# Patient Record
Sex: Female | Born: 1940 | Race: Black or African American | Hispanic: No | Smoking: Never smoker
Health system: Southern US, Community
[De-identification: ages and names within clinical notes are randomized; demographics above are authoritative.]

## PROBLEM LIST (undated history)

## (undated) DIAGNOSIS — K219 Gastro-esophageal reflux disease without esophagitis: Secondary | ICD-10-CM

## (undated) DIAGNOSIS — E079 Disorder of thyroid, unspecified: Secondary | ICD-10-CM

## (undated) DIAGNOSIS — I10 Essential (primary) hypertension: Secondary | ICD-10-CM

## (undated) DIAGNOSIS — N189 Chronic kidney disease, unspecified: Secondary | ICD-10-CM

## (undated) DIAGNOSIS — G459 Transient cerebral ischemic attack, unspecified: Secondary | ICD-10-CM

## (undated) DIAGNOSIS — E785 Hyperlipidemia, unspecified: Secondary | ICD-10-CM

## (undated) HISTORY — PX: CHOLECYSTECTOMY: SHX55

## (undated) HISTORY — PX: ABDOMINAL HYSTERECTOMY: SHX81

## (undated) HISTORY — PX: BREAST SURGERY: SHX581

---

## 2000-04-14 DIAGNOSIS — G459 Transient cerebral ischemic attack, unspecified: Secondary | ICD-10-CM

## 2000-04-14 HISTORY — DX: Transient cerebral ischemic attack, unspecified: G45.9

## 2009-03-11 ENCOUNTER — Ambulatory Visit: Payer: Self-pay | Admitting: Diagnostic Radiology

## 2009-03-11 ENCOUNTER — Emergency Department (HOSPITAL_BASED_OUTPATIENT_CLINIC_OR_DEPARTMENT_OTHER): Admission: EM | Admit: 2009-03-11 | Discharge: 2009-03-11 | Payer: Self-pay | Admitting: Emergency Medicine

## 2009-08-07 ENCOUNTER — Encounter (HOSPITAL_COMMUNITY): Admission: RE | Admit: 2009-08-07 | Discharge: 2009-10-12 | Payer: Self-pay | Admitting: Internal Medicine

## 2010-01-25 ENCOUNTER — Encounter: Payer: Self-pay | Admitting: Emergency Medicine

## 2010-01-25 ENCOUNTER — Ambulatory Visit: Payer: Self-pay | Admitting: Diagnostic Radiology

## 2010-01-26 ENCOUNTER — Inpatient Hospital Stay (HOSPITAL_COMMUNITY): Admission: EM | Admit: 2010-01-26 | Discharge: 2010-01-26 | Payer: Self-pay | Admitting: Internal Medicine

## 2010-06-26 LAB — PTH, INTACT AND CALCIUM: PTH: 145 pg/mL — ABNORMAL HIGH (ref 14.0–72.0)

## 2010-06-26 LAB — CBC
HCT: 37 % (ref 36.0–46.0)
MCH: 28.3 pg (ref 26.0–34.0)
MCHC: 32.4 g/dL (ref 30.0–36.0)
Platelets: 207 10*3/uL (ref 150–400)
RBC: 4.32 MIL/uL (ref 3.87–5.11)
RDW: 13.2 % (ref 11.5–15.5)
WBC: 6.7 10*3/uL (ref 4.0–10.5)

## 2010-06-26 LAB — COMPREHENSIVE METABOLIC PANEL
ALT: 36 U/L — ABNORMAL HIGH (ref 0–35)
Albumin: 3.4 g/dL — ABNORMAL LOW (ref 3.5–5.2)
Alkaline Phosphatase: 82 U/L (ref 39–117)
Chloride: 107 mEq/L (ref 96–112)
Glucose, Bld: 91 mg/dL (ref 70–99)
Potassium: 4.1 mEq/L (ref 3.5–5.1)
Sodium: 137 mEq/L (ref 135–145)
Total Bilirubin: 0.4 mg/dL (ref 0.3–1.2)
Total Protein: 6 g/dL (ref 6.0–8.3)

## 2010-06-26 LAB — HEMOGLOBIN A1C
Hgb A1c MFr Bld: 8.7 % — ABNORMAL HIGH (ref <5.7)
Mean Plasma Glucose: 203 mg/dL — ABNORMAL HIGH (ref <117)

## 2010-06-26 LAB — BASIC METABOLIC PANEL
Calcium: 10.6 mg/dL — ABNORMAL HIGH (ref 8.4–10.5)
GFR calc Af Amer: >60 mL/min (ref >60)
GFR calc non Af Amer: >60 mL/min (ref >60)
Glucose, Bld: 163 mg/dL — ABNORMAL HIGH (ref 70–99)
Sodium: 139 mEq/L (ref 135–145)

## 2010-06-26 LAB — CARDIAC PANEL(CRET KIN+CKTOT+MB+TROPI)
CK, MB: 2.1 ng/mL (ref 0.3–4.0)
Total CK: 119 U/L (ref 7–177)
Troponin I: 0.01 ng/mL (ref 0.00–0.06)

## 2010-06-26 LAB — GLUCOSE, CAPILLARY: Glucose-Capillary: 89 mg/dL (ref 70–99)

## 2010-06-26 LAB — VITAMIN D 1,25 DIHYDROXY: Vitamin D 1, 25 (OH)2 Total: 39 pg/mL (ref 18–72)

## 2010-06-26 LAB — PHOSPHORUS: Phosphorus: 3.1 mg/dL (ref 2.3–4.6)

## 2010-06-26 LAB — HEPATIC FUNCTION PANEL
Indirect Bilirubin: 0.3 mg/dL (ref 0.3–0.9)
Total Protein: 6 g/dL (ref 6.0–8.3)

## 2010-06-26 LAB — LIPID PANEL
Cholesterol: 98 mg/dL (ref 0–200)
HDL: 39 mg/dL — ABNORMAL LOW (ref >39)
LDL Cholesterol: 47 mg/dL (ref 0–99)
Total CHOL/HDL Ratio: 2.5 RATIO

## 2010-06-27 LAB — BASIC METABOLIC PANEL
BUN: 27 mg/dL — ABNORMAL HIGH (ref 6–23)
Chloride: 105 mEq/L (ref 96–112)
Creatinine, Ser: 1 mg/dL (ref 0.4–1.2)

## 2010-06-27 LAB — URINALYSIS, ROUTINE W REFLEX MICROSCOPIC
Ketones, ur: NEGATIVE mg/dL
Nitrite: NEGATIVE
Protein, ur: NEGATIVE mg/dL
Urobilinogen, UA: 0.2 mg/dL (ref 0.0–1.0)

## 2010-06-27 LAB — DIFFERENTIAL
Basophils Absolute: 0 10*3/uL (ref 0.0–0.1)
Basophils Relative: 1 % (ref 0–1)
Eosinophils Absolute: 0.2 10*3/uL (ref 0.0–0.7)
Eosinophils Relative: 2 % (ref 0–5)
Lymphocytes Relative: 35 % (ref 12–46)

## 2010-06-27 LAB — GLUCOSE, CAPILLARY: Glucose-Capillary: 209 mg/dL — ABNORMAL HIGH (ref 70–99)

## 2010-06-27 LAB — CBC
MCH: 29.6 pg (ref 26.0–34.0)
MCHC: 33.4 g/dL (ref 30.0–36.0)
MCV: 88.7 fL (ref 78.0–100.0)
Platelets: 203 10*3/uL (ref 150–400)
RDW: 12.8 % (ref 11.5–15.5)
WBC: 6.6 10*3/uL (ref 4.0–10.5)

## 2011-04-22 ENCOUNTER — Emergency Department (HOSPITAL_BASED_OUTPATIENT_CLINIC_OR_DEPARTMENT_OTHER)
Admission: EM | Admit: 2011-04-22 | Discharge: 2011-04-22 | Disposition: A | Discharge status: Home / Self Care | Payer: Medicare Other | Attending: Emergency Medicine | Admitting: Emergency Medicine

## 2011-04-22 ENCOUNTER — Encounter: Payer: Self-pay | Admitting: Family Medicine

## 2011-04-22 ENCOUNTER — Other Ambulatory Visit: Payer: Self-pay

## 2011-04-22 ENCOUNTER — Emergency Department (INDEPENDENT_AMBULATORY_CARE_PROVIDER_SITE_OTHER): Payer: Medicare Other

## 2011-04-22 DIAGNOSIS — E119 Type 2 diabetes mellitus without complications: Secondary | ICD-10-CM | POA: Insufficient documentation

## 2011-04-22 DIAGNOSIS — R109 Unspecified abdominal pain: Secondary | ICD-10-CM | POA: Insufficient documentation

## 2011-04-22 DIAGNOSIS — M25519 Pain in unspecified shoulder: Secondary | ICD-10-CM

## 2011-04-22 DIAGNOSIS — W19XXXA Unspecified fall, initial encounter: Secondary | ICD-10-CM

## 2011-04-22 DIAGNOSIS — Z79899 Other long term (current) drug therapy: Secondary | ICD-10-CM | POA: Insufficient documentation

## 2011-04-22 DIAGNOSIS — N189 Chronic kidney disease, unspecified: Secondary | ICD-10-CM | POA: Insufficient documentation

## 2011-04-22 DIAGNOSIS — K219 Gastro-esophageal reflux disease without esophagitis: Secondary | ICD-10-CM | POA: Insufficient documentation

## 2011-04-22 DIAGNOSIS — R079 Chest pain, unspecified: Secondary | ICD-10-CM

## 2011-04-22 DIAGNOSIS — I129 Hypertensive chronic kidney disease with stage 1 through stage 4 chronic kidney disease, or unspecified chronic kidney disease: Secondary | ICD-10-CM | POA: Insufficient documentation

## 2011-04-22 DIAGNOSIS — M79609 Pain in unspecified limb: Secondary | ICD-10-CM

## 2011-04-22 HISTORY — DX: Chronic kidney disease, unspecified: N18.9

## 2011-04-22 HISTORY — DX: Essential (primary) hypertension: I10

## 2011-04-22 HISTORY — DX: Gastro-esophageal reflux disease without esophagitis: K21.9

## 2011-04-22 LAB — COMPREHENSIVE METABOLIC PANEL
ALT: 33 U/L (ref 0–35)
AST: 18 U/L (ref 0–37)
Alkaline Phosphatase: 98 U/L (ref 39–117)
CO2: 28 mEq/L (ref 19–32)
Calcium: 12.5 mg/dL — ABNORMAL HIGH (ref 8.4–10.5)
GFR calc non Af Amer: 50 mL/min — ABNORMAL LOW (ref >90)
Glucose, Bld: 114 mg/dL — ABNORMAL HIGH (ref 70–99)
Potassium: 4.2 mEq/L (ref 3.5–5.1)
Sodium: 138 mEq/L (ref 135–145)

## 2011-04-22 LAB — URINALYSIS, ROUTINE W REFLEX MICROSCOPIC
Bilirubin Urine: NEGATIVE
Hgb urine dipstick: NEGATIVE
Nitrite: NEGATIVE
Protein, ur: NEGATIVE mg/dL
Urobilinogen, UA: 0.2 mg/dL (ref 0.0–1.0)

## 2011-04-22 LAB — CBC
HCT: 40 % (ref 36.0–46.0)
Hemoglobin: 13.3 g/dL (ref 12.0–15.0)
MCH: 28.1 pg (ref 26.0–34.0)
MCHC: 33.3 g/dL (ref 30.0–36.0)
RDW: 13.7 % (ref 11.5–15.5)

## 2011-04-22 LAB — DIFFERENTIAL
Basophils Absolute: 0 10*3/uL (ref 0.0–0.1)
Basophils Relative: 0 % (ref 0–1)
Eosinophils Absolute: 0.2 10*3/uL (ref 0.0–0.7)
Monocytes Absolute: 0.9 10*3/uL (ref 0.1–1.0)
Monocytes Relative: 11 % (ref 3–12)
Neutrophils Relative %: 46 % (ref 43–77)

## 2011-04-22 MED ORDER — HYDROCODONE-ACETAMINOPHEN 5-325 MG PO TABS: 1.0000 | ORAL_TABLET | ORAL | Status: AC | PRN

## 2011-04-22 NOTE — ED Provider Notes (Signed)
1:57 PM  Date: 04/22/2011  Rate: 61  Rhythm: normal sinus rhythm  QRS Axis: normal  Intervals: normal  ST/T Wave abnormalities: normal  Conduction Disutrbances:none  Narrative Interpretation: Normal EKG  Old EKG Reviewed: none available    Carleene Cooper III, MD 04/22/11 1358

## 2011-04-22 NOTE — ED Notes (Addendum)
PCP is Dr. Venetia Maxon in [place].

## 2011-04-22 NOTE — ED Notes (Signed)
Pt c/o upper abdominal pain and sts "I've had problems like this and taking medicine for it for years". Pt sts pain has not resolved with medication "this time". Pt also c/o left arm pain x 3 months post fall. Pt denies shob, v/d.

## 2011-04-22 NOTE — ED Provider Notes (Signed)
History     CSN: [id number]  Arrival date & time 04/22/11  1238   None     Chief Complaint  Patient presents with  . Abdominal Pain    (Consider location/radiation/quality/duration/timing/severity/associated sxs/prior treatment) Patient is a 71 y.o. female presenting with shoulder pain. The history is provided by the patient and a relative. No language interpreter was used.  Shoulder Pain This is a new problem. The current episode started 1 to 4 weeks ago. The problem occurs constantly. The problem has been gradually worsening. Associated symptoms include joint swelling. The symptoms are aggravated by nothing. She has tried nothing for the symptoms. The treatment provided moderate relief.  Shoulder Pain This is a new problem. The current episode started 1 to 4 weeks ago. The problem occurs constantly. The problem has been gradually worsening. The symptoms are aggravated by nothing. She has tried nothing for the symptoms. The treatment provided moderate relief.  Pt complains of pain in left shoulder after falling 3 weeks ago.  Pt reports arm and shoulder feel swollen.  Pt reports she has not felt well for several weeks.  Pt reports she sees Dr. Ludwig Clarks.  Pt reports her glucose has been high and she has been told her kidney function is decreasing.    Past Medical History  Diagnosis Date  . GERD (gastroesophageal reflux disease)   . Diabetes mellitus   . Hypertension   . Chronic kidney disease     Past Surgical History  Procedure Date  . Abdominal hysterectomy   . Breast surgery   . Cholecystectomy     No family history on file.  History  Substance Use Topics  . Smoking status: Never Smoker   . Smokeless tobacco: Not on file  . Alcohol Use: No    OB History    Grav Para Term Preterm Abortions TAB SAB Ect Mult Living                  Review of Systems  Musculoskeletal: Positive for joint swelling.  All other systems reviewed and are negative.    Allergies    Codeine  Home Medications   Current Outpatient Rx  Name Route Sig Dispense Refill  . ATORVASTATIN CALCIUM 80 MG PO TABS Oral Take 80 mg by mouth daily.      Marland Kitchen CLONIDINE HCL 0.3 MG PO TABS Oral Take 0.3 mg by mouth at bedtime.      . CLOPIDOGREL BISULFATE 75 MG PO TABS Oral Take 75 mg by mouth daily.      . DEXLANSOPRAZOLE 60 MG PO CPDR Oral Take 60 mg by mouth daily.      Marland Kitchen EZETIMIBE 10 MG PO TABS Oral Take 10 mg by mouth daily.      . FUROSEMIDE 20 MG PO TABS Oral Take 40 mg by mouth 2 (two) times daily.      Marland Kitchen LINAGLIPTIN 5 MG PO TABS Oral Take 5 mg by mouth daily.      Marland Kitchen METFORMIN HCL 500 MG PO TABS Oral Take 500 mg by mouth 2 (two) times daily with a meal.      . METOPROLOL SUCCINATE ER 200 MG PO TB24 Oral Take 100 mg by mouth daily.      Marland Kitchen SPIRONOLACTONE 25 MG PO TABS Oral Take 50 mg by mouth daily.      Marland Kitchen VALSARTAN 320 MG PO TABS Oral Take 320 mg by mouth daily.        BP 164/68  Pulse 62  Temp(Src) 97.7 F (36.5 C) (Oral)  Resp 16  Ht 5\' 8"  (1.727 m)  Wt 221 lb (100.245 kg)  BMI 33.60 kg/m2  SpO2 98%  Physical Exam  Nursing note and vitals reviewed. Constitutional: She is oriented to person, place, and time. She appears well-developed and well-nourished.  HENT:  Head: Normocephalic and atraumatic.  Right Ear: External ear normal.  Left Ear: External ear normal.  Nose: Nose normal.  Mouth/Throat: Oropharynx is clear and moist.  Eyes: Conjunctivae and EOM are normal. Pupils are equal, round, and reactive to light.  Neck: Normal range of motion. Neck supple.  Cardiovascular: Normal rate and normal heart sounds.   Pulmonary/Chest: Effort normal.  Abdominal: Soft.  Musculoskeletal: She exhibits tenderness.       Tender left shoulder and left humerus,  Decreased range of motion  Neurological: She is alert and oriented to person, place, and time. She has normal reflexes.  Skin: Skin is warm.  Psychiatric: She has a normal mood and affect.    ED Course  Procedures  (including critical care time)  Labs Reviewed - No data to display No results found.   1. Shoulder pain       MDM   Results for orders placed during the hospital encounter of 04/22/11  COMPREHENSIVE METABOLIC PANEL      Component Value Range   Sodium 138  135 - 145 (mEq/L)   Potassium 4.2  3.5 - 5.1 (mEq/L)   Chloride 99  96 - 112 (mEq/L)   CO2 28  19 - 32 (mEq/L)   Glucose, Bld 114 (*) 70 - 99 (mg/dL)   BUN 29 (*) 6 - 23 (mg/dL)   Creatinine, Ser 4.09  0.50 - 1.10 (mg/dL)   Calcium 81.1 (*) 8.4 - 10.5 (mg/dL)   Total Protein 8.1  6.0 - 8.3 (g/dL)   Albumin 4.4  3.5 - 5.2 (g/dL)   AST 18  0 - 37 (U/L)   ALT 33  0 - 35 (U/L)   Alkaline Phosphatase 98  39 - 117 (U/L)   Total Bilirubin 0.3  0.3 - 1.2 (mg/dL)   GFR calc non Af Amer 50 (*) >90 (mL/min)   GFR calc Af Amer 58 (*) >90 (mL/min)  CBC      Component Value Range   WBC 7.8  4.0 - 10.5 (K/uL)   RBC 4.74  3.87 - 5.11 (MIL/uL)   Hemoglobin 13.3  12.0 - 15.0 (g/dL)   HCT 91.4  78.2 - 95.6 (%)   MCV 84.4  78.0 - 100.0 (fL)   MCH 28.1  26.0 - 34.0 (pg)   MCHC 33.3  30.0 - 36.0 (g/dL)   RDW 21.3  08.6 - 57.8 (%)   Platelets 241  150 - 400 (K/uL)  DIFFERENTIAL      Component Value Range   Neutrophils Relative 46  43 - 77 (%)   Neutro Abs 3.6  1.7 - 7.7 (K/uL)   Lymphocytes Relative 41  12 - 46 (%)   Lymphs Abs 3.2  0.7 - 4.0 (K/uL)   Monocytes Relative 11  3 - 12 (%)   Monocytes Absolute 0.9  0.1 - 1.0 (K/uL)   Eosinophils Relative 2  0 - 5 (%)   Eosinophils Absolute 0.2  0.0 - 0.7 (K/uL)   Basophils Relative 0  0 - 1 (%)   Basophils Absolute 0.0  0.0 - 0.1 (K/uL)  URINALYSIS, ROUTINE W REFLEX MICROSCOPIC      Component Value Range  Color, Urine YELLOW  YELLOW    APPearance CLEAR  CLEAR    Specific Gravity, Urine 1.009  1.005 - 1.030    pH 5.0  5.0 - 8.0    Glucose, UA NEGATIVE  NEGATIVE (mg/dL)   Hgb urine dipstick NEGATIVE  NEGATIVE    Bilirubin Urine NEGATIVE  NEGATIVE    Ketones, ur NEGATIVE  NEGATIVE  (mg/dL)   Protein, ur NEGATIVE  NEGATIVE (mg/dL)   Urobilinogen, UA 0.2  0.0 - 1.0 (mg/dL)   Nitrite NEGATIVE  NEGATIVE    Leukocytes, UA NEGATIVE  NEGATIVE    Dg Chest 2 View  04/22/2011  *RADIOLOGY REPORT*  Clinical Data: Chest pain  CHEST - 2 VIEW  Comparison: January 25, 2010  Findings: The cardiac silhouette, mediastinum, pulmonary vasculature are within normal limits.  Both lungs are clear. There is no acute bony abnormality.  IMPRESSION: There is no evidence of acute cardiac or pulmonary process.  Original Report Authenticated By: Brandon Melnick, M.D.   Dg Shoulder Left  04/22/2011  *RADIOLOGY REPORT*  Clinical Data: Pain after fall  LEFT SHOULDER - 2+ VIEW  Comparison: None.  Findings: The [facility] joint is intact and the subacromial space is maintained.  There is no evidence of fracture or dislocation. There is mild undersurface spurring at the [facility] joint which can predispose to rotator cuff injury.  IMPRESSION: There is no evidence of acute abnormality.  If there is clinical concern regarding a rotator cuff injury, MRI may be of help.  Original Report Authenticated By: Brandon Melnick, M.D.   Dg Humerus Left  04/22/2011  *RADIOLOGY REPORT*  Clinical Data: Pain after fall  LEFT HUMERUS - 2+ VIEW  Comparison: None.  Findings: There is no evidence of bone, joint, or soft tissue abnormality.  IMPRESSION: Negative left forearm.  Original Report Authenticated By: Brandon Melnick, M.D.      Medical screening examination/treatment/procedure(s) were performed by non-physician practitioner and as supervising physician I was immediately available for consultation/collaboration. Osvaldo Human, M.D.      [place], [place] 04/25/11 1610  Carleene Cooper III, MD 04/25/11 [phone]

## 2011-07-29 ENCOUNTER — Encounter (HOSPITAL_BASED_OUTPATIENT_CLINIC_OR_DEPARTMENT_OTHER): Payer: Self-pay | Admitting: Emergency Medicine

## 2011-07-29 ENCOUNTER — Emergency Department (HOSPITAL_BASED_OUTPATIENT_CLINIC_OR_DEPARTMENT_OTHER)
Admission: EM | Admit: 2011-07-29 | Discharge: 2011-07-29 | Disposition: A | Discharge status: Home / Self Care | Payer: Medicare Other | Attending: Emergency Medicine | Admitting: Emergency Medicine

## 2011-07-29 DIAGNOSIS — Z833 Family history of diabetes mellitus: Secondary | ICD-10-CM | POA: Insufficient documentation

## 2011-07-29 DIAGNOSIS — I129 Hypertensive chronic kidney disease with stage 1 through stage 4 chronic kidney disease, or unspecified chronic kidney disease: Secondary | ICD-10-CM | POA: Insufficient documentation

## 2011-07-29 DIAGNOSIS — Z823 Family history of stroke: Secondary | ICD-10-CM | POA: Insufficient documentation

## 2011-07-29 DIAGNOSIS — K219 Gastro-esophageal reflux disease without esophagitis: Secondary | ICD-10-CM | POA: Insufficient documentation

## 2011-07-29 DIAGNOSIS — W57XXXA Bitten or stung by nonvenomous insect and other nonvenomous arthropods, initial encounter: Secondary | ICD-10-CM

## 2011-07-29 DIAGNOSIS — Z8249 Family history of ischemic heart disease and other diseases of the circulatory system: Secondary | ICD-10-CM | POA: Insufficient documentation

## 2011-07-29 DIAGNOSIS — E119 Type 2 diabetes mellitus without complications: Secondary | ICD-10-CM | POA: Insufficient documentation

## 2011-07-29 DIAGNOSIS — Z8673 Personal history of transient ischemic attack (TIA), and cerebral infarction without residual deficits: Secondary | ICD-10-CM | POA: Insufficient documentation

## 2011-07-29 DIAGNOSIS — Z8261 Family history of arthritis: Secondary | ICD-10-CM | POA: Insufficient documentation

## 2011-07-29 DIAGNOSIS — Z90710 Acquired absence of both cervix and uterus: Secondary | ICD-10-CM | POA: Insufficient documentation

## 2011-07-29 DIAGNOSIS — N189 Chronic kidney disease, unspecified: Secondary | ICD-10-CM | POA: Insufficient documentation

## 2011-07-29 DIAGNOSIS — R21 Rash and other nonspecific skin eruption: Secondary | ICD-10-CM | POA: Insufficient documentation

## 2011-07-29 DIAGNOSIS — Z9089 Acquired absence of other organs: Secondary | ICD-10-CM | POA: Insufficient documentation

## 2011-07-29 HISTORY — DX: Transient cerebral ischemic attack, unspecified: G45.9

## 2011-07-29 MED ORDER — PERMETHRIN 5 % EX CREA: TOPICAL_CREAM | CUTANEOUS | Status: AC

## 2011-07-29 NOTE — ED Notes (Signed)
Patient states she developed a itchy rash over arms on Saturday.  Now has red rash on her face and neck now.

## 2011-07-29 NOTE — Discharge Instructions (Signed)
Insect Bite Mosquitoes, flies, fleas, bedbugs, and many other insects can bite. Insect bites are different from insect stings. A sting is when venom is injected into the skin. Some insect bites can transmit infectious diseases. SYMPTOMS  Insect bites usually turn red, swell, and itch for 2 to 4 days. They often go away on their own. TREATMENT  Your caregiver may prescribe antibiotic medicines if a bacterial infection develops in the bite. HOME CARE INSTRUCTIONS  Do not scratch the bite area.   Keep the bite area clean and dry. Wash the bite area thoroughly with soap and water.   Put ice or cool compresses on the bite area.   Put ice in a plastic bag.   Place a towel between your skin and the bag.   Leave the ice on for 20 minutes, 4 times a day for the first 2 to 3 days, or as directed.   You may apply a baking soda paste, cortisone cream, or calamine lotion to the bite area as directed by your caregiver. This can help reduce itching and swelling.   Only take over-the-counter or prescription medicines as directed by your caregiver.   If you are given antibiotics, take them as directed. Finish them even if you start to feel better.  You may need a tetanus shot if:  You cannot remember when you had your last tetanus shot.   You have never had a tetanus shot.   The injury broke your skin.  If you get a tetanus shot, your arm may swell, get red, and feel warm to the touch. This is common and not a problem. If you need a tetanus shot and you choose not to have one, there is a rare chance of getting tetanus. Sickness from tetanus can be serious. SEEK IMMEDIATE MEDICAL CARE IF:   You have increased pain, redness, or swelling in the bite area.   You see a red line on the skin coming from the bite.   You have a fever.   You have joint pain.   You have a headache or neck pain.   You have unusual weakness.   You have a rash.   You have chest pain or shortness of breath.   You  have abdominal pain, nausea, or vomiting.   You feel unusually tired or sleepy.  MAKE SURE YOU:   Understand these instructions.   Will watch your condition.   Will get help right away if you are not doing well or get worse.  Document Released: 05/08/2004 Document Revised: 03/20/2011 Document Reviewed: 10/30/2010 [facility] Patient Information 2012 [place], [place].Insect Bite Mosquitoes, flies, fleas, bedbugs, and many other insects can bite. Insect bites are different from insect stings. A sting is when venom is injected into the skin. Some insect bites can transmit infectious diseases. SYMPTOMS  Insect bites usually turn red, swell, and itch for 2 to 4 days. They often go away on their own. TREATMENT  Your caregiver may prescribe antibiotic medicines if a bacterial infection develops in the bite. HOME CARE INSTRUCTIONS  Do not scratch the bite area.   Keep the bite area clean and dry. Wash the bite area thoroughly with soap and water.   Put ice or cool compresses on the bite area.   Put ice in a plastic bag.   Place a towel between your skin and the bag.   Leave the ice on for 20 minutes, 4 times a day for the first 2 to 3 days, or as directed.  You may apply a baking soda paste, cortisone cream, or calamine lotion to the bite area as directed by your caregiver. This can help reduce itching and swelling.   Only take over-the-counter or prescription medicines as directed by your caregiver.   If you are given antibiotics, take them as directed. Finish them even if you start to feel better.  You may need a tetanus shot if:  You cannot remember when you had your last tetanus shot.   You have never had a tetanus shot.   The injury broke your skin.  If you get a tetanus shot, your arm may swell, get red, and feel warm to the touch. This is common and not a problem. If you need a tetanus shot and you choose not to have one, there is a rare chance of getting tetanus. Sickness from  tetanus can be serious. SEEK IMMEDIATE MEDICAL CARE IF:   You have increased pain, redness, or swelling in the bite area.   You see a red line on the skin coming from the bite.   You have a fever.   You have joint pain.   You have a headache or neck pain.   You have unusual weakness.   You have a rash.   You have chest pain or shortness of breath.   You have abdominal pain, nausea, or vomiting.   You feel unusually tired or sleepy.  MAKE SURE YOU:   Understand these instructions.   Will watch your condition.   Will get help right away if you are not doing well or get worse.  Document Released: 05/08/2004 Document Revised: 03/20/2011 Document Reviewed: 10/30/2010 [facility] Patient Information 2012 [place], [place].

## 2011-07-29 NOTE — ED Provider Notes (Signed)
Medical screening examination/treatment/procedure(s) were performed by non-physician practitioner and as supervising physician I was immediately available for consultation/collaboration.   Loren Racer, MD 07/29/11 [phone]

## 2011-07-29 NOTE — ED Notes (Signed)
Woke up Sunday with rash on upper body seams to be getting worse

## 2011-07-29 NOTE — ED Provider Notes (Signed)
History     CSN: [id number]  Arrival date & time 07/29/11  1241   First MD Initiated Contact with Patient 07/29/11 1433      Chief Complaint  Patient presents with  . Rash    Rash on upper boby with itching     (Consider location/radiation/quality/duration/timing/severity/associated sxs/prior treatment) Patient is a 71 y.o. female presenting with rash. The history is provided by the patient. No language interpreter was used.  Rash  This is a new problem. The current episode started more than 2 days ago. The problem has not changed since onset.The problem is associated with nothing. There has been no fever. The rash is present on the left arm and right arm. The pain is at a severity of 5/10. The pain is mild. Associated symptoms include itching. She has tried nothing for the symptoms. The treatment provided no relief. Risk factors include new medications.   Pt complains of a itchy rash Past Medical History  Diagnosis Date  . GERD (gastroesophageal reflux disease)   . Diabetes mellitus   . Hypertension   . Chronic kidney disease   . TIA (transient ischemic attack) 2002    Past Surgical History  Procedure Date  . Abdominal hysterectomy   . Breast surgery   . Cholecystectomy     Family History  Problem Relation Age of Onset  . Rheum arthritis Mother   . Diabetes Mother   . Heart failure Mother   . Hypertension Mother   . Rheum arthritis Father   . Diabetes Father   . Hypertension Father   . Stroke Father     History  Substance Use Topics  . Smoking status: Never Smoker   . Smokeless tobacco: Not on file  . Alcohol Use: No    OB History    Grav Para Term Preterm Abortions TAB SAB Ect Mult Living                  Review of Systems  Skin: Positive for itching and rash.  All other systems reviewed and are negative.    Allergies  Codeine  Home Medications   Current Outpatient Rx  Name Route Sig Dispense Refill  . ASPIRIN 81 MG PO TABS Oral Take 81 mg  by mouth daily.    . ATORVASTATIN CALCIUM 80 MG PO TABS Oral Take 80 mg by mouth daily.      Marland Kitchen CLONIDINE HCL 0.3 MG PO TABS Oral Take 0.3 mg by mouth at bedtime.      . CLOPIDOGREL BISULFATE 75 MG PO TABS Oral Take 75 mg by mouth daily.      . DEXLANSOPRAZOLE 60 MG PO CPDR Oral Take 60 mg by mouth daily.      Marland Kitchen EZETIMIBE 10 MG PO TABS Oral Take 10 mg by mouth daily.      . FUROSEMIDE 20 MG PO TABS Oral Take 40 mg by mouth 2 (two) times daily.      Marland Kitchen LINAGLIPTIN 5 MG PO TABS Oral Take 5 mg by mouth daily.      Marland Kitchen METFORMIN HCL 500 MG PO TABS Oral Take 500 mg by mouth 2 (two) times daily with a meal.      . METOPROLOL SUCCINATE ER 200 MG PO TB24 Oral Take 100 mg by mouth daily.      Marland Kitchen SPIRONOLACTONE 25 MG PO TABS Oral Take 50 mg by mouth daily.      Marland Kitchen VALSARTAN 320 MG PO TABS Oral Take 320 mg by  mouth daily.        BP 155/66  Pulse 74  Temp(Src) 98 F (36.7 C) (Oral)  Resp 18  Ht 5\' 9"  (1.753 m)  Wt 220 lb (99.791 kg)  BMI 32.49 kg/m2  SpO2 96%  Physical Exam  Nursing note and vitals reviewed. Constitutional: She appears well-developed and well-nourished.  HENT:  Head: Normocephalic and atraumatic.  Eyes: Conjunctivae and EOM are normal. Pupils are equal, round, and reactive to light.  Neck: Normal range of motion. Neck supple.  Cardiovascular: Normal rate.   Pulmonary/Chest: Effort normal and breath sounds normal.  Abdominal: Soft.  Musculoskeletal: Normal range of motion.  Neurological: She is alert.  Skin: Rash noted.       Possible burrows,  Scattered areas on arms,  Look like bites  Psychiatric: She has a normal mood and affect.    ED Course  Procedures (including critical care time)  Labs Reviewed - No data to display No results found.   No diagnosis found.    MDM  I will treat with elemite,  May be scabies,  Could just be insect bites       Lonia Skinner [place], [place] 07/29/11 [phone]

## 2012-03-10 ENCOUNTER — Ambulatory Visit
Admission: RE | Admit: 2012-03-10 | Discharge: 2012-03-10 | Disposition: A | Discharge status: Home / Self Care | Payer: Medicare Other | Source: Ambulatory Visit | Attending: Internal Medicine | Admitting: Internal Medicine

## 2012-03-10 ENCOUNTER — Other Ambulatory Visit: Payer: Self-pay | Admitting: Internal Medicine

## 2012-03-10 DIAGNOSIS — R52 Pain, unspecified: Secondary | ICD-10-CM

## 2012-09-30 ENCOUNTER — Emergency Department (HOSPITAL_BASED_OUTPATIENT_CLINIC_OR_DEPARTMENT_OTHER): Payer: Medicare Other

## 2012-09-30 ENCOUNTER — Emergency Department (HOSPITAL_BASED_OUTPATIENT_CLINIC_OR_DEPARTMENT_OTHER)
Admission: EM | Admit: 2012-09-30 | Discharge: 2012-09-30 | Disposition: A | Discharge status: Home / Self Care | Payer: Medicare Other | Attending: Emergency Medicine | Admitting: Emergency Medicine

## 2012-09-30 ENCOUNTER — Encounter (HOSPITAL_BASED_OUTPATIENT_CLINIC_OR_DEPARTMENT_OTHER): Payer: Self-pay | Admitting: *Deleted

## 2012-09-30 DIAGNOSIS — Z90710 Acquired absence of both cervix and uterus: Secondary | ICD-10-CM | POA: Insufficient documentation

## 2012-09-30 DIAGNOSIS — R109 Unspecified abdominal pain: Secondary | ICD-10-CM | POA: Insufficient documentation

## 2012-09-30 DIAGNOSIS — Z8673 Personal history of transient ischemic attack (TIA), and cerebral infarction without residual deficits: Secondary | ICD-10-CM | POA: Insufficient documentation

## 2012-09-30 DIAGNOSIS — E119 Type 2 diabetes mellitus without complications: Secondary | ICD-10-CM | POA: Insufficient documentation

## 2012-09-30 DIAGNOSIS — Z7982 Long term (current) use of aspirin: Secondary | ICD-10-CM | POA: Insufficient documentation

## 2012-09-30 DIAGNOSIS — K219 Gastro-esophageal reflux disease without esophagitis: Secondary | ICD-10-CM | POA: Insufficient documentation

## 2012-09-30 DIAGNOSIS — N189 Chronic kidney disease, unspecified: Secondary | ICD-10-CM | POA: Insufficient documentation

## 2012-09-30 DIAGNOSIS — R110 Nausea: Secondary | ICD-10-CM | POA: Insufficient documentation

## 2012-09-30 DIAGNOSIS — Z79899 Other long term (current) drug therapy: Secondary | ICD-10-CM | POA: Insufficient documentation

## 2012-09-30 DIAGNOSIS — I129 Hypertensive chronic kidney disease with stage 1 through stage 4 chronic kidney disease, or unspecified chronic kidney disease: Secondary | ICD-10-CM | POA: Insufficient documentation

## 2012-09-30 LAB — COMPREHENSIVE METABOLIC PANEL
ALT: 47 U/L — ABNORMAL HIGH (ref 0–35)
Albumin: 4.1 g/dL (ref 3.5–5.2)
Alkaline Phosphatase: 131 U/L — ABNORMAL HIGH (ref 39–117)
BUN: 35 mg/dL — ABNORMAL HIGH (ref 6–23)
Chloride: 101 mEq/L (ref 96–112)
Glucose, Bld: 280 mg/dL — ABNORMAL HIGH (ref 70–99)
Potassium: 4.1 mEq/L (ref 3.5–5.1)
Sodium: 138 mEq/L (ref 135–145)
Total Bilirubin: 0.2 mg/dL — ABNORMAL LOW (ref 0.3–1.2)
Total Protein: 7.9 g/dL (ref 6.0–8.3)

## 2012-09-30 LAB — CBC WITH DIFFERENTIAL/PLATELET
Basophils Relative: 0 % (ref 0–1)
HCT: 37.3 % (ref 36.0–46.0)
Lymphs Abs: 3.1 10*3/uL (ref 0.7–4.0)
MCH: 29.2 pg (ref 26.0–34.0)
MCV: 85 fL (ref 78.0–100.0)
Monocytes Relative: 10 % (ref 3–12)
Neutro Abs: 3 10*3/uL (ref 1.7–7.7)
Neutrophils Relative %: 44 % (ref 43–77)
Platelets: 226 10*3/uL (ref 150–400)

## 2012-09-30 LAB — LIPASE, BLOOD: Lipase: 33 U/L (ref 11–59)

## 2012-09-30 LAB — URINALYSIS, ROUTINE W REFLEX MICROSCOPIC
Bilirubin Urine: NEGATIVE
Glucose, UA: >1000 mg/dL — AB
Hgb urine dipstick: NEGATIVE
Ketones, ur: NEGATIVE mg/dL
Protein, ur: NEGATIVE mg/dL

## 2012-09-30 MED ORDER — MORPHINE SULFATE 4 MG/ML IJ SOLN
4.0000 mg | Freq: Once | INTRAMUSCULAR | Status: AC
  Administered 2012-09-30: 4 mg via INTRAVENOUS
  Filled 2012-09-30: qty 1

## 2012-09-30 MED ORDER — IOHEXOL 300 MG/ML  SOLN
50.0000 mL | Freq: Once | INTRAMUSCULAR | Status: AC | PRN
  Administered 2012-09-30: 50 mL via ORAL

## 2012-09-30 MED ORDER — SODIUM CHLORIDE 0.9 % IV BOLUS (SEPSIS)
1000.0000 mL | Freq: Once | INTRAVENOUS | Status: AC
  Administered 2012-09-30: 1000 mL via INTRAVENOUS

## 2012-09-30 MED ORDER — ONDANSETRON HCL 4 MG/2ML IJ SOLN
4.0000 mg | Freq: Once | INTRAMUSCULAR | Status: AC
Start: 2012-09-30 — End: 2012-09-30
  Administered 2012-09-30: 4 mg via INTRAVENOUS
  Filled 2012-09-30: qty 2

## 2012-09-30 MED ORDER — HYDROCODONE-ACETAMINOPHEN 5-325 MG PO TABS: 1.0000 | ORAL_TABLET | Freq: Four times a day (QID) | ORAL | Status: AC | PRN

## 2012-09-30 MED ORDER — ONDANSETRON HCL 4 MG/2ML IJ SOLN
4.0000 mg | Freq: Once | INTRAMUSCULAR | Status: AC
  Administered 2012-09-30: 4 mg via INTRAVENOUS
  Filled 2012-09-30: qty 2

## 2012-09-30 MED ORDER — IOHEXOL 300 MG/ML  SOLN
100.0000 mL | Freq: Once | INTRAMUSCULAR | Status: AC | PRN
  Administered 2012-09-30: 80 mL via INTRAVENOUS

## 2012-09-30 NOTE — ED Notes (Signed)
Pt returned from ct

## 2012-09-30 NOTE — ED Notes (Signed)
Right upper quad pain and nausea all day. Hx of cholecystectomy years ago.

## 2012-09-30 NOTE — ED Provider Notes (Addendum)
History     CSN: [id number]  Arrival date & time 09/30/12  2017   First MD Initiated Contact with Patient 09/30/12 2019      Chief Complaint  Patient presents with  . Abdominal Pain    (Consider location/radiation/quality/duration/timing/severity/associated sxs/prior treatment) Patient is a 72 y.o. female presenting with abdominal pain. The history is provided by the patient.  Abdominal Pain This is a new problem. The current episode started 12 to 24 hours ago. The problem occurs constantly. The problem has been gradually worsening. Associated symptoms include abdominal pain. Pertinent negatives include no chest pain and no shortness of breath. Associated symptoms comments: Nausea but no vomiting and no BM today but normal stool yesterday. Exacerbated by: palpation. Nothing relieves the symptoms. She has tried nothing for the symptoms. The treatment provided no relief.    Past Medical History  Diagnosis Date  . GERD (gastroesophageal reflux disease)   . Diabetes mellitus   . Hypertension   . Chronic kidney disease   . TIA (transient ischemic attack) 2002    Past Surgical History  Procedure Laterality Date  . Abdominal hysterectomy    . Breast surgery    . Cholecystectomy      Family History  Problem Relation Age of Onset  . Rheum arthritis Mother   . Diabetes Mother   . Heart failure Mother   . Hypertension Mother   . Rheum arthritis Father   . Diabetes Father   . Hypertension Father   . Stroke Father     History  Substance Use Topics  . Smoking status: Never Smoker   . Smokeless tobacco: Not on file  . Alcohol Use: No    OB History   Grav Para Term Preterm Abortions TAB SAB Ect Mult Living                  Review of Systems  Constitutional: Negative for fever.  Respiratory: Negative for cough and shortness of breath.   Cardiovascular: Negative for chest pain.  Gastrointestinal: Positive for nausea and abdominal pain. Negative for vomiting, diarrhea  and constipation.  Genitourinary: Negative for dysuria and hematuria.  All other systems reviewed and are negative.    Allergies  Codeine  Home Medications   Current Outpatient Rx  Name  Route  Sig  Dispense  Refill  . cinacalcet (SENSIPAR) 30 MG tablet   Oral   Take 30 mg by mouth daily.         Marland Kitchen aspirin 81 MG tablet   Oral   Take 81 mg by mouth daily.         Marland Kitchen atorvastatin (LIPITOR) 80 MG tablet   Oral   Take 80 mg by mouth daily.           . cloNIDine (CATAPRES) 0.3 MG tablet   Oral   Take 0.3 mg by mouth at bedtime.           . clopidogrel (PLAVIX) 75 MG tablet   Oral   Take 75 mg by mouth daily.           Marland Kitchen dexlansoprazole (DEXILANT) 60 MG capsule   Oral   Take 60 mg by mouth daily.           Marland Kitchen ezetimibe (ZETIA) 10 MG tablet   Oral   Take 10 mg by mouth daily.           . furosemide (LASIX) 20 MG tablet   Oral   Take 40 mg by  mouth 2 (two) times daily.           Marland Kitchen linagliptin (TRADJENTA) 5 MG TABS tablet   Oral   Take 5 mg by mouth daily.           . metFORMIN (GLUCOPHAGE) 500 MG tablet   Oral   Take 500 mg by mouth 2 (two) times daily with a meal.           . metoprolol (TOPROL-XL) 200 MG 24 hr tablet   Oral   Take 100 mg by mouth daily.           Marland Kitchen spironolactone (ALDACTONE) 25 MG tablet   Oral   Take 50 mg by mouth daily.           . valsartan (DIOVAN) 320 MG tablet   Oral   Take 320 mg by mouth daily.             BP 180/54  Pulse 59  Temp(Src) 98.6 F (37 C) (Oral)  Resp 20  Wt 229 lb (103.874 kg)  BMI 33.8 kg/m2  SpO2 96%  Physical Exam  Nursing note and vitals reviewed. Constitutional: She is oriented to person, place, and time. She appears well-developed and well-nourished. No distress.  HENT:  Head: Normocephalic and atraumatic.  Mouth/Throat: Oropharynx is clear and moist.  Eyes: Conjunctivae and EOM are normal. Pupils are equal, round, and reactive to light.  Neck: Normal range of motion.  Neck supple.  Cardiovascular: Normal rate, regular rhythm and intact distal pulses.   No murmur heard. Pulmonary/Chest: Effort normal and breath sounds normal. No respiratory distress. She has no wheezes. She has no rales.  Abdominal: Soft. Bowel sounds are normal. She exhibits no distension. There is tenderness. There is guarding. There is no rebound.    Well healed abd scar  Musculoskeletal: Normal range of motion. She exhibits no edema and no tenderness.  Neurological: She is alert and oriented to person, place, and time.  Skin: Skin is warm and dry. No rash noted. No erythema.  Psychiatric: She has a normal mood and affect. Her behavior is normal.    ED Course  Procedures (including critical care time)  Labs Reviewed  URINALYSIS, ROUTINE W REFLEX MICROSCOPIC - Abnormal; Notable for the following:    Glucose, UA >1000 (*)    All other components within normal limits  COMPREHENSIVE METABOLIC PANEL - Abnormal; Notable for the following:    Glucose, Bld 280 (*)    BUN 35 (*)    Creatinine, Ser 1.50 (*)    ALT 47 (*)    Alkaline Phosphatase 131 (*)    Total Bilirubin 0.2 (*)    GFR calc non Af Amer 34 (*)    GFR calc Af Amer 39 (*)    All other components within normal limits  CBC WITH DIFFERENTIAL  LIPASE, BLOOD  URINE MICROSCOPIC-ADD ON   Ct Abdomen Pelvis W Contrast  09/30/2012   *RADIOLOGY REPORT*  Clinical Data: Right-sided abdominal pain.  Nausea.  CT ABDOMEN AND PELVIS WITH CONTRAST  Technique:  Multidetector CT imaging of the abdomen and pelvis was performed following the standard protocol during bolus administration of intravenous contrast.  Contrast: 50mL OMNIPAQUE IOHEXOL 300 MG/ML  SOLN, 80mL OMNIPAQUE IOHEXOL 300 MG/ML  SOLN  Comparison: No priors.  Findings:  Lung Bases: Mild dependent atelectasis in the lung bases bilaterally.  Abdomen/Pelvis:  The appearance of the liver, pancreas, spleen, bilateral adrenal glands and bilateral kidneys is unremarkable. Status post  cholecystectomy.  Atherosclerosis throughout the abdominal and pelvic vasculature, without definite aneurysm or dissection.  Numerous colonic diverticula are noted in the region of the sigmoid colon, without surrounding inflammatory changes to suggest acute diverticulitis at this time.  Normal appendix.  No significant volume of ascites.  No pneumoperitoneum.  No pathologic distension of small bowel.  No definite pathologic lymphadenopathy identified within the abdomen or pelvis.  A tiny umbilical hernia containing only omental fat incidentally noted.  Status post hysterectomy.  Ovaries are not confidently identified may be surgically absent or atrophic.  Urinary bladder is unremarkable in appearance.  Musculoskeletal: Extensive skin thickening and stranding in the subcutaneous fat in the patient's pannus may suggest panniculitis. There are no aggressive appearing lytic or blastic lesions noted in the visualized portions of the skeleton.  IMPRESSION: 1.  No acute findings in the abdomen or pelvis to account for the patient's symptoms. 2.  Colonic diverticulosis without evidence to suggest acute diverticulitis at this time. 3.  Status post cholecystectomy. 4.  Skin thickening and stranding in the subcutaneous fat of the patient's pannus suspicious for potential panniculitis.  Clinical correlation is recommended. 5.  Small umbilical hernia containing only omental fat. 6.  Additional incidental findings, as above.   Original Report Authenticated By: Trudie Reed, M.D.     1. Abdominal  pain, other specified site       MDM   Patient with abdominal pain in the right abdomen. She is status post cholecystectomy but states she does have appendix. He started this morning when she woke up and has gradually worsened throughout the day with some mild nausea. She has a mid right-sided abdominal pain but no flank pain or symptoms concerning for a kidney stone. She states her appetite has been normal today needing to  make it worse. She denies any urinary symptoms or alcohol use. Low suspicion for diverticulitis or pancreatitis at this time. Possibility for appendicitis. Patient has no prior history of liver disease but states in the past she has been told she had a fatty liver.  CBC, CMP, lipase, UA, CT abdomen and pelvis pending for further evaluation for rule out of appendicitis.  Patient given pain medication.  10:55 PM Labs unremarkable.  Ct without explanation for pt's pain and no signs of panniculitis on exam.  Findings discussed with pt and family.  She will f/u with [facility] and GI prn.      Gwyneth Sprout, MD 09/30/12 [id number]  Gwyneth Sprout, MD 09/30/12 2257

## 2012-09-30 NOTE — ED Notes (Signed)
Patient transported to CT 

## 2014-01-14 ENCOUNTER — Emergency Department
Admission: EM | Admit: 2014-01-14 | Discharge: 2014-01-14 | Discharge status: Absent without leave | Payer: 59 | Source: Home / Self Care

## 2014-04-15 ENCOUNTER — Emergency Department (INDEPENDENT_AMBULATORY_CARE_PROVIDER_SITE_OTHER)
Admission: EM | Admit: 2014-04-15 | Discharge: 2014-04-15 | Disposition: A | Discharge status: Home / Self Care | Payer: 59 | Source: Home / Self Care | Attending: Family Medicine | Admitting: Family Medicine

## 2014-04-15 ENCOUNTER — Encounter: Payer: Self-pay | Admitting: Emergency Medicine

## 2014-04-15 DIAGNOSIS — J069 Acute upper respiratory infection, unspecified: Secondary | ICD-10-CM

## 2014-04-15 DIAGNOSIS — B9789 Other viral agents as the cause of diseases classified elsewhere: Principal | ICD-10-CM

## 2014-04-15 DIAGNOSIS — J0100 Acute maxillary sinusitis, unspecified: Secondary | ICD-10-CM

## 2014-04-15 HISTORY — DX: Disorder of thyroid, unspecified: E07.9

## 2014-04-15 HISTORY — DX: Hyperlipidemia, unspecified: E78.5

## 2014-04-15 MED ORDER — BENZONATATE 200 MG PO CAPS: 200.0000 mg | ORAL_CAPSULE | Freq: Every day | ORAL | Status: DC

## 2014-04-15 MED ORDER — AMOXICILLIN 875 MG PO TABS: 875.0000 mg | ORAL_TABLET | Freq: Two times a day (BID) | ORAL | Status: DC

## 2014-04-15 NOTE — ED Provider Notes (Signed)
CSN: [id number]     Arrival date & time 04/15/14  4098 History   First MD Initiated Contact with Patient 04/15/14 1033     Chief Complaint  Patient presents with  . Nasal Congestion  . Cough  . Hoarse  . Headache  . Sore Throat      HPI Comments: Patient complains of one week history of typical cold-like symptoms including mild sore throat, sinus congestion, headache, fatigue, and cough.  Two days ago her sinus congestion and cough became worse and she had increasing chills, worse at night.  The history is provided by the patient.    Past Medical History  Diagnosis Date  . GERD (gastroesophageal reflux disease)   . Diabetes mellitus   . Hypertension   . Chronic kidney disease   . TIA (transient ischemic attack) 2002  . Hyperlipidemia   . Thyroid disease    Past Surgical History  Procedure Laterality Date  . Abdominal hysterectomy    . Breast surgery    . Cholecystectomy     Family History  Problem Relation Age of Onset  . Rheum arthritis Mother   . Diabetes Mother   . Heart failure Mother   . Hypertension Mother   . Rheum arthritis Father   . Diabetes Father   . Hypertension Father   . Stroke Father    History  Substance Use Topics  . Smoking status: Never Smoker   . Smokeless tobacco: Not on file  . Alcohol Use: No   OB History    No data available     Review of Systems + sore throat + hoarseness + cough No pleuritic pain No wheezing + nasal congestion + post-nasal drainage + sinus pain/pressure No itchy/red eyes No earache No hemoptysis No SOB No fever, + chills No nausea No vomiting No abdominal pain No diarrhea No urinary symptoms No skin rash + fatigue No myalgias + headache Used OTC meds without relief  Allergies  Codeine  Home Medications   Prior to Admission medications   Medication Sig Start Date End Date Taking? Authorizing Provider  amoxicillin (AMOXIL) 875 MG tablet Take 1 tablet (875 mg total) by mouth 2 (two) times  daily. 04/15/14   Lattie Haw, MD  aspirin 81 MG tablet Take 81 mg by mouth daily.    Historical Provider, MD  atorvastatin (LIPITOR) 80 MG tablet Take 80 mg by mouth daily.      Historical Provider, MD  benzonatate (TESSALON) 200 MG capsule Take 1 capsule (200 mg total) by mouth at bedtime. Take as needed for cough 04/15/14   Lattie Haw, MD  cinacalcet (SENSIPAR) 30 MG tablet Take 30 mg by mouth daily.    Historical Provider, MD  cloNIDine (CATAPRES) 0.3 MG tablet Take 0.3 mg by mouth at bedtime.      Historical Provider, MD  clopidogrel (PLAVIX) 75 MG tablet Take 75 mg by mouth daily.      Historical Provider, MD  dexlansoprazole (DEXILANT) 60 MG capsule Take 60 mg by mouth daily.      Historical Provider, MD  ezetimibe (ZETIA) 10 MG tablet Take 10 mg by mouth daily.      Historical Provider, MD  furosemide (LASIX) 20 MG tablet Take 40 mg by mouth 2 (two) times daily.      Historical Provider, MD  HYDROcodone-acetaminophen (NORCO/VICODIN) 5-325 MG per tablet Take 1 tablet by mouth every 6 (six) hours as needed for pain. 09/30/12   Gwyneth Sprout, MD  linagliptin (TRADJENTA)  5 MG TABS tablet Take 5 mg by mouth daily.      Historical Provider, MD  metFORMIN (GLUCOPHAGE) 500 MG tablet Take 500 mg by mouth 2 (two) times daily with a meal.      Historical Provider, MD  metoprolol (TOPROL-XL) 200 MG 24 hr tablet Take 100 mg by mouth daily.      Historical Provider, MD  spironolactone (ALDACTONE) 25 MG tablet Take 50 mg by mouth daily.      Historical Provider, MD  valsartan (DIOVAN) 320 MG tablet Take 320 mg by mouth daily.      Historical Provider, MD   BP 186/70 mmHg  Pulse 57  Temp(Src) 98 F (36.7 C) (Oral)  Resp 18  Ht  (1.727 m)  Wt 228 lb (103.42 kg)  BMI 34.68 kg/m2  SpO2 93% Physical Exam Nursing notes and Vital Signs reviewed. Appearance:  Patient appears stated age, and in no acute distress.  Patient is obese (BMI 34.7) Eyes:  Pupils are equal, round, and reactive to  light and accomodation.  Extraocular movement is intact.  Conjunctivae are not inflamed  Ears:  Canals normal.  Tympanic membranes normal.  Nose:  Very congested turbinates.  ? sinus tenderness.     Pharynx:  Normal Neck:  Supple.   Larger tender posterior nodes are palpated bilaterally  Lungs:  Clear to auscultation.  Breath sounds are equal.  Heart:  Regular rate and rhythm without murmurs, rubs, or gallops.  Abdomen:  Nontender without masses or hepatosplenomegaly.  Bowel sounds are present.  No CVA or flank tenderness.  Extremities:  No edema.  No calf tenderness Skin:  No rash present.   ED Course  Procedures  none   MDM   1. Viral URI with cough   2. Acute maxillary sinusitis, recurrence not specified    Begin amoxicillin .  Prescription written for Benzonatate (Tessalon) to take at bedtime for night-time cough.  Take plain Mucinex (1200 mg guaifenesin) twice daily for cough and congestion.  Increase fluid intake, rest. May use Afrin nasal spray (or generic oxymetazoline) twice daily for about 5 days.  Also recommend using saline nasal spray several times daily and saline nasal irrigation (AYR is a common brand) Try warm salt water gargles for sore throat.  Stop all antihistamines for now, and other non-prescription cough/cold preparations.    Follow-up with family doctor if not improving about10 days.    Lattie Haw, MD 04/21/14 [id number]

## 2014-04-15 NOTE — Discharge Instructions (Signed)
Take plain Mucinex (1200 mg guaifenesin) twice daily for cough and congestion.  Increase fluid intake, rest. °May use Afrin nasal spray (or generic oxymetazoline) twice daily for about 5 days.  Also recommend using saline nasal spray several times daily and saline nasal irrigation (AYR is a common brand) °Try warm salt water gargles for sore throat.  °Stop all antihistamines for now, and other non-prescription cough/cold preparations. °Follow-up with family doctor if not improving about10 days.  °

## 2014-04-15 NOTE — ED Notes (Signed)
Reports one week of intermittently worsening of congestion, cough, hoarseness, headache and sore throat. Denies fever. Has not taken regular meds today (including anti-hypertensive). States BGs wnl. Did have Flu vaccination this season.

## 2015-06-02 ENCOUNTER — Encounter: Payer: Self-pay | Admitting: Emergency Medicine

## 2015-06-02 ENCOUNTER — Emergency Department
Admission: EM | Admit: 2015-06-02 | Discharge: 2015-06-02 | Disposition: A | Discharge status: Home / Self Care | Payer: Medicare Other | Source: Home / Self Care | Attending: Family Medicine | Admitting: Family Medicine

## 2015-06-02 DIAGNOSIS — L739 Follicular disorder, unspecified: Secondary | ICD-10-CM | POA: Diagnosis not present

## 2015-06-02 MED ORDER — DOXYCYCLINE HYCLATE 100 MG PO CAPS: 100.0000 mg | ORAL_CAPSULE | Freq: Two times a day (BID) | ORAL | Status: AC

## 2015-06-02 MED ORDER — TRIAMCINOLONE ACETONIDE 0.1 % EX CREA: TOPICAL_CREAM | CUTANEOUS | Status: AC

## 2015-06-02 NOTE — ED Provider Notes (Signed)
CSN: [id number]     Arrival date & time 06/02/15  1525 History   First MD Initiated Contact with Patient 06/02/15 1641     Chief Complaint  Patient presents with  . Rash      HPI Comments: Patient complains of one month history of pruritic rash on her arms (primarily forearms) and legs below the knees.  The rash has been worse during the past week with increased itching.  She feels well otherwise.  Patient is a 75 y.o. female presenting with rash. The history is provided by the patient.  Rash Location: arms and legs. Quality: itchiness and redness   Quality: not blistering, not bruising, not burning, not draining, not painful, not peeling, not scaling, not swelling and not weeping   Severity:  Mild Onset quality:  Gradual Duration:  1 month Timing:  Constant Progression:  Unchanged Chronicity:  New Context: not animal contact, not chemical exposure, not exposure to similar rash, not food, not hot tub use, not insect bite/sting, not medications, not new detergent/soap, not nuts, not plant contact and not sick contacts   Relieved by:  Nothing Worsened by:  Nothing tried Ineffective treatments:  None tried Associated symptoms: headaches and nausea   Associated symptoms: no abdominal pain, no diarrhea, no fatigue, no fever, no induration, no joint pain, no myalgias, no shortness of breath, no sore throat, no throat swelling, no URI and not wheezing     Past Medical History  Diagnosis Date  . GERD (gastroesophageal reflux disease)   . Diabetes mellitus   . Hypertension   . Chronic kidney disease   . TIA (transient ischemic attack) 2002  . Hyperlipidemia   . Thyroid disease    Past Surgical History  Procedure Laterality Date  . Abdominal hysterectomy    . Breast surgery    . Cholecystectomy     Family History  Problem Relation Age of Onset  . Rheum arthritis Mother   . Diabetes Mother   . Heart failure Mother   . Hypertension Mother   . Rheum arthritis Father   . Diabetes  Father   . Hypertension Father   . Stroke Father    Social History  Substance Use Topics  . Smoking status: Never Smoker   . Smokeless tobacco: None  . Alcohol Use: No   OB History    No data available     Review of Systems  Constitutional: Negative for fever and fatigue.  HENT: Negative for sore throat.   Respiratory: Negative for shortness of breath and wheezing.   Gastrointestinal: Positive for nausea. Negative for abdominal pain and diarrhea.  Musculoskeletal: Negative for myalgias and arthralgias.  Skin: Positive for rash.  Neurological: Positive for headaches.  All other systems reviewed and are negative.   Allergies  Codeine  Home Medications   Prior to Admission medications   Medication Sig Start Date End Date Taking? Authorizing Provider  aspirin 81 MG tablet Take 81 mg by mouth daily.    Historical Provider, MD  atorvastatin (LIPITOR) 80 MG tablet Take 80 mg by mouth daily.      Historical Provider, MD  cinacalcet (SENSIPAR) 30 MG tablet Take 30 mg by mouth daily.    Historical Provider, MD  cloNIDine (CATAPRES) 0.3 MG tablet Take 0.3 mg by mouth at bedtime.      Historical Provider, MD  clopidogrel (PLAVIX) 75 MG tablet Take 75 mg by mouth daily.      Historical Provider, MD  dexlansoprazole (DEXILANT) 60 MG capsule  Take 60 mg by mouth daily.      Historical Provider, MD  doxycycline (VIBRAMYCIN) 100 MG capsule Take 1 capsule (100 mg total) by mouth 2 (two) times daily. Take with food. 06/02/15   Lattie Haw, MD  ezetimibe (ZETIA) 10 MG tablet Take 10 mg by mouth daily.      Historical Provider, MD  furosemide (LASIX) 20 MG tablet Take 40 mg by mouth 2 (two) times daily.      Historical Provider, MD  HYDROcodone-acetaminophen (NORCO/VICODIN) 5-325 MG per tablet Take 1 tablet by mouth every 6 (six) hours as needed for pain. 09/30/12   Gwyneth Sprout, MD  linagliptin (TRADJENTA) 5 MG TABS tablet Take 5 mg by mouth daily.      Historical Provider, MD  metFORMIN  (GLUCOPHAGE) 500 MG tablet Take 500 mg by mouth 2 (two) times daily with a meal.      Historical Provider, MD  metoprolol (TOPROL-XL) 200 MG 24 hr tablet Take 100 mg by mouth daily.      Historical Provider, MD  spironolactone (ALDACTONE) 25 MG tablet Take 50 mg by mouth daily.      Historical Provider, MD  triamcinolone cream (KENALOG) 0.1 % Apply thin film to rash two or three times daily 06/02/15   Lattie Haw, MD  valsartan (DIOVAN) 320 MG tablet Take 320 mg by mouth daily.      Historical Provider, MD   Meds Ordered and Administered this Visit  Medications - No data to display  BP 143/61 mmHg  Pulse 57  Temp(Src) 98.1 F (36.7 C) (Oral)  Ht  (1.753 m)  Wt 235 lb 8 oz (106.822 kg)  BMI 34.76 kg/m2  SpO2 97% No data found.   Physical Exam  Constitutional: She is oriented to person, place, and time. She appears well-developed and well-nourished.  Patient is obese (BMI 34.8)  HENT:  Head: Normocephalic.  Mouth/Throat: Oropharynx is clear and moist.  Eyes: Pupils are equal, round, and reactive to light.  Neck: Neck supple.  Cardiovascular: Normal heart sounds.   Pulmonary/Chest: Breath sounds normal.  Lymphadenopathy:    She has no cervical adenopathy.  Neurological: She is alert and oriented to person, place, and time.  Skin: Skin is warm and dry.     Sparsely scattered follicular excoriations on arms and legs as noted on diagram.    Nursing note and vitals reviewed.   ED Course  Procedures  None    MDM   1. Folliculitis    Begin Doxycycline  BID.  Rx for triamcinolone 0.1% cream TID. Followup with dermatologist if not improving two weeks.    Lattie Haw, MD 06/05/15 [phone]

## 2015-06-02 NOTE — Discharge Instructions (Signed)
May take Benadryl at bedtime as needed for itching. 

## 2015-06-02 NOTE — ED Notes (Signed)
Pt c/o rash on arms and legs x 1 week, very itchy.  No change in detergents or soaps.

## 2017-05-13 ENCOUNTER — Emergency Department (HOSPITAL_BASED_OUTPATIENT_CLINIC_OR_DEPARTMENT_OTHER): Payer: Medicare Other

## 2017-05-13 ENCOUNTER — Encounter (HOSPITAL_BASED_OUTPATIENT_CLINIC_OR_DEPARTMENT_OTHER): Payer: Self-pay | Admitting: *Deleted

## 2017-05-13 ENCOUNTER — Other Ambulatory Visit: Payer: Self-pay

## 2017-05-13 ENCOUNTER — Emergency Department (HOSPITAL_BASED_OUTPATIENT_CLINIC_OR_DEPARTMENT_OTHER)
Admission: EM | Admit: 2017-05-13 | Discharge: 2017-05-13 | Disposition: A | Discharge status: Home / Self Care | Payer: Medicare Other | Attending: Emergency Medicine | Admitting: Emergency Medicine

## 2017-05-13 DIAGNOSIS — R1084 Generalized abdominal pain: Secondary | ICD-10-CM

## 2017-05-13 DIAGNOSIS — E1122 Type 2 diabetes mellitus with diabetic chronic kidney disease: Secondary | ICD-10-CM | POA: Insufficient documentation

## 2017-05-13 DIAGNOSIS — N189 Chronic kidney disease, unspecified: Secondary | ICD-10-CM | POA: Insufficient documentation

## 2017-05-13 DIAGNOSIS — Z7984 Long term (current) use of oral hypoglycemic drugs: Secondary | ICD-10-CM | POA: Insufficient documentation

## 2017-05-13 DIAGNOSIS — E079 Disorder of thyroid, unspecified: Secondary | ICD-10-CM | POA: Insufficient documentation

## 2017-05-13 DIAGNOSIS — R197 Diarrhea, unspecified: Secondary | ICD-10-CM | POA: Insufficient documentation

## 2017-05-13 DIAGNOSIS — R112 Nausea with vomiting, unspecified: Secondary | ICD-10-CM | POA: Diagnosis not present

## 2017-05-13 DIAGNOSIS — I129 Hypertensive chronic kidney disease with stage 1 through stage 4 chronic kidney disease, or unspecified chronic kidney disease: Secondary | ICD-10-CM | POA: Insufficient documentation

## 2017-05-13 DIAGNOSIS — Z79899 Other long term (current) drug therapy: Secondary | ICD-10-CM | POA: Diagnosis not present

## 2017-05-13 DIAGNOSIS — Z8673 Personal history of transient ischemic attack (TIA), and cerebral infarction without residual deficits: Secondary | ICD-10-CM | POA: Insufficient documentation

## 2017-05-13 DIAGNOSIS — Z7982 Long term (current) use of aspirin: Secondary | ICD-10-CM | POA: Diagnosis not present

## 2017-05-13 DIAGNOSIS — R109 Unspecified abdominal pain: Secondary | ICD-10-CM | POA: Diagnosis present

## 2017-05-13 DIAGNOSIS — Z7902 Long term (current) use of antithrombotics/antiplatelets: Secondary | ICD-10-CM | POA: Insufficient documentation

## 2017-05-13 LAB — CBC WITH DIFFERENTIAL/PLATELET
Basophils Absolute: 0 10*3/uL (ref 0.0–0.1)
Basophils Relative: 0 %
EOS ABS: 0.1 10*3/uL (ref 0.0–0.7)
Eosinophils Relative: 1 %
HCT: 39.2 % (ref 36.0–46.0)
HEMOGLOBIN: 12.9 g/dL (ref 12.0–15.0)
LYMPHS ABS: 2.2 10*3/uL (ref 0.7–4.0)
Lymphocytes Relative: 22 %
MCH: 27.6 pg (ref 26.0–34.0)
MCHC: 32.9 g/dL (ref 30.0–36.0)
MCV: 83.8 fL (ref 78.0–100.0)
MONO ABS: 1 10*3/uL (ref 0.1–1.0)
MONOS PCT: 10 %
NEUTROS PCT: 67 %
Neutro Abs: 6.6 10*3/uL (ref 1.7–7.7)
Platelets: 266 10*3/uL (ref 150–400)
RBC: 4.68 MIL/uL (ref 3.87–5.11)
RDW: 15.6 % — AB (ref 11.5–15.5)
WBC: 10 10*3/uL (ref 4.0–10.5)

## 2017-05-13 LAB — COMPREHENSIVE METABOLIC PANEL
ALBUMIN: 3.8 g/dL (ref 3.5–5.0)
ALK PHOS: 92 U/L (ref 38–126)
ALT: 47 U/L (ref 14–54)
ANION GAP: 9 (ref 5–15)
AST: 25 U/L (ref 15–41)
BILIRUBIN TOTAL: 0.3 mg/dL (ref 0.3–1.2)
BUN: 28 mg/dL — AB (ref 6–20)
CALCIUM: 10.1 mg/dL (ref 8.9–10.3)
CO2: 28 mmol/L (ref 22–32)
CREATININE: 1.62 mg/dL — AB (ref 0.44–1.00)
Chloride: 102 mmol/L (ref 101–111)
GFR calc Af Amer: 34 mL/min — ABNORMAL LOW (ref >60)
GFR calc non Af Amer: 30 mL/min — ABNORMAL LOW (ref >60)
GLUCOSE: 166 mg/dL — AB (ref 65–99)
Potassium: 4.2 mmol/L (ref 3.5–5.1)
SODIUM: 139 mmol/L (ref 135–145)
Total Protein: 7.5 g/dL (ref 6.5–8.1)

## 2017-05-13 LAB — URINALYSIS, ROUTINE W REFLEX MICROSCOPIC
BILIRUBIN URINE: NEGATIVE
Glucose, UA: 100 mg/dL — AB
Ketones, ur: NEGATIVE mg/dL
Leukocytes, UA: NEGATIVE
Nitrite: NEGATIVE
Protein, ur: 100 mg/dL — AB
SPECIFIC GRAVITY, URINE: 1.015 (ref 1.005–1.030)
pH: 6 (ref 5.0–8.0)

## 2017-05-13 LAB — URINALYSIS, MICROSCOPIC (REFLEX)

## 2017-05-13 LAB — LIPASE, BLOOD: Lipase: 22 U/L (ref 11–51)

## 2017-05-13 MED ORDER — ONDANSETRON HCL 4 MG/2ML IJ SOLN
4.0000 mg | Freq: Once | INTRAMUSCULAR | Status: AC
  Administered 2017-05-13: 4 mg via INTRAVENOUS
  Filled 2017-05-13: qty 2

## 2017-05-13 MED ORDER — ONDANSETRON 4 MG PO TBDP
4.0000 mg | ORAL_TABLET | Freq: Once | ORAL | Status: AC
  Administered 2017-05-13: 4 mg via ORAL
  Filled 2017-05-13: qty 1

## 2017-05-13 MED ORDER — FENTANYL CITRATE (PF) 100 MCG/2ML IJ SOLN
50.0000 ug | Freq: Once | INTRAMUSCULAR | Status: AC
Start: 2017-05-13 — End: 2017-05-13
  Administered 2017-05-13: 50 ug via INTRAVENOUS
  Filled 2017-05-13: qty 2

## 2017-05-13 MED ORDER — SODIUM CHLORIDE 0.9 % IV BOLUS (SEPSIS)
1000.0000 mL | Freq: Once | INTRAVENOUS | Status: AC
  Administered 2017-05-13: 1000 mL via INTRAVENOUS

## 2017-05-13 MED ORDER — ONDANSETRON 4 MG PO TBDP: 4.0000 mg | ORAL_TABLET | Freq: Three times a day (TID) | ORAL | 1 refills | Status: AC | PRN

## 2017-05-13 NOTE — ED Provider Notes (Signed)
[facility] EMERGENCY DEPARTMENT Provider Note   CSN: [id number] Arrival date & time: 05/13/17  1336     History   Chief Complaint Chief Complaint  Patient presents with  . Abdominal Cramping    HPI Kimberly Skinner is a 77 y.o. female.  HPI  77 year old female with a history of diabetes and chronic kidney disease presents with acute abdominal pain and vomiting.  She started having abdominal pain this morning a few hours ago after eating breakfast.  Shortly after the pain started she started having multiple episodes of emesis.  Started having some diarrhea when she arrived here.  The vomiting has not had any blood and has mostly been yellow.  The abdominal pain is rated as a 9/10.  No chest pain or shortness of breath.  No fevers.  She has not been on antibiotics recently or had any travel.  She has not taken anything for the symptoms.  She has vomited numerous times.  She denies any sick contacts.  The pain is sharp and mostly lower.  It is continuous.  Some cramping.  Past Medical History:  Diagnosis Date  . Chronic kidney disease   . Diabetes mellitus   . GERD (gastroesophageal reflux disease)   . Hyperlipidemia   . Hypertension   . Thyroid disease   . TIA (transient ischemic attack) 2002    There are no active problems to display for this patient.   Past Surgical History:  Procedure Laterality Date  . ABDOMINAL HYSTERECTOMY    . BREAST SURGERY    . CHOLECYSTECTOMY      OB History    No data available       Home Medications    Prior to Admission medications   Medication Sig Start Date End Date Taking? Authorizing Provider  aspirin 81 MG tablet Take 81 mg by mouth daily.    [provider]  atorvastatin (LIPITOR) 80 MG tablet Take 80 mg by mouth daily.      [provider]  cinacalcet (SENSIPAR) 30 MG tablet Take 30 mg by mouth daily.    [provider]  cloNIDine (CATAPRES) 0.3 MG tablet Take 0.3 mg by mouth at bedtime.       [provider]  clopidogrel (PLAVIX) 75 MG tablet Take 75 mg by mouth daily.      [provider]  dexlansoprazole (DEXILANT) 60 MG capsule Take 60 mg by mouth daily.      [provider]  doxycycline (VIBRAMYCIN) 100 MG capsule Take 1 capsule (100 mg total) by mouth 2 (two) times daily. Take with food. 06/02/15   Lattie Haw, MD  ezetimibe (ZETIA) 10 MG tablet Take 10 mg by mouth daily.      [provider]  furosemide (LASIX) 20 MG tablet Take 40 mg by mouth 2 (two) times daily.      [provider]  HYDROcodone-acetaminophen (NORCO/VICODIN) 5-325 MG per tablet Take 1 tablet by mouth every 6 (six) hours as needed for pain. 09/30/12   Gwyneth Sprout, MD  linagliptin (TRADJENTA) 5 MG TABS tablet Take 5 mg by mouth daily.      [provider]  metoprolol (TOPROL-XL) 200 MG 24 hr tablet Take 100 mg by mouth daily.      [provider]  spironolactone (ALDACTONE) 25 MG tablet Take 50 mg by mouth daily.      [provider]  triamcinolone cream (KENALOG) 0.1 % Apply thin film to rash two or three  times daily 06/02/15   Lattie HawBeese, Stephen A, MD  valsartan (DIOVAN) 320 MG tablet Take 320 mg by mouth daily.      [provider]    Family History Family History  Problem Relation Age of Onset  . Rheum arthritis Mother   . Diabetes Mother   . Heart failure Mother   . Hypertension Mother   . Rheum arthritis Father   . Diabetes Father   . Hypertension Father   . Stroke Father     Social History Social History   Tobacco Use  . Smoking status: Never Smoker  Substance Use Topics  . Alcohol use: No  . Drug use: No     Allergies   Codeine   Review of Systems Review of Systems  Constitutional: Negative for fever.  Respiratory: Negative for shortness of breath.   Cardiovascular: Negative for chest pain.  Gastrointestinal: Positive for abdominal pain, diarrhea, nausea and vomiting. Negative for  abdominal distention and blood in stool.  Genitourinary: Negative for dysuria.  All other systems reviewed and are negative.    Physical Exam Updated Vital Signs BP (!) 169/56   Pulse (!) 54   Temp 98 F (36.7 C) (Oral)   Resp 16   Ht 5\' 8"  (1.727 m)   Wt 104.8 kg (231 lb)   SpO2 96%   BMI 35.12 kg/m   Physical Exam  Constitutional: She is oriented to person, place, and time. She appears well-developed and well-nourished. No distress.  obese  HENT:  Head: Normocephalic and atraumatic.  Right Ear: External ear normal.  Left Ear: External ear normal.  Nose: Nose normal.  Eyes: Right eye exhibits no discharge. Left eye exhibits no discharge.  Cardiovascular: Normal rate, regular rhythm and normal heart sounds.  Pulmonary/Chest: Effort normal and breath sounds normal.  Abdominal: Soft. She exhibits no distension. There is tenderness (diffuse, worst in upper abdomen).  Neurological: She is alert and oriented to person, place, and time.  Skin: Skin is warm and dry. She is not diaphoretic.  Nursing note and vitals reviewed.    ED Treatments / Results  Labs (all labs ordered are listed, but only abnormal results are displayed) Labs Reviewed  COMPREHENSIVE METABOLIC PANEL - Abnormal; Notable for the following components:      Result Value   Glucose, Bld 166 (*)    BUN 28 (*)    Creatinine, Ser 1.62 (*)    GFR calc non Af Amer 30 (*)    GFR calc Af Amer 34 (*)    All other components within normal limits  CBC WITH DIFFERENTIAL/PLATELET - Abnormal; Notable for the following components:   RDW 15.6 (*)    All other components within normal limits  LIPASE, BLOOD  URINALYSIS, ROUTINE W REFLEX MICROSCOPIC    EKG  EKG Interpretation  Date/Time:  Wednesday May 13 2017 15:09:14 EST Ventricular Rate:  56 PR Interval:    QRS Duration: 101 QT Interval:  488 QTC Calculation: 471 R Axis:   82 Text Interpretation:  Sinus rhythm Prolonged PR interval Borderline right axis  deviation Nonspecific T abnormalities, lateral leads Baseline wander in lead(s) V3 V6 no significant change since 2013 Confirmed by Pricilla LovelessGoldston, Okie Jansson [phone]([id number]) on 05/13/2017 3:15:16 PM Also confirmed by Pricilla LovelessGoldston, Kimla Furth [phone]), editor Sheppard EvensSimpson, Miranda ([phone])  on 05/13/2017 3:59:43 PM       Radiology No results found.  Procedures Procedures (including critical care time)  Medications Ordered in ED Medications  sodium chloride 0.9 % bolus 1,000 mL (1,000  mLs Intravenous New Bag/Given 05/13/17 1458)  ondansetron (ZOFRAN) injection 4 mg (4 mg Intravenous Given 05/13/17 1458)  fentaNYL (SUBLIMAZE) injection 50 mcg (50 mcg Intravenous Given 05/13/17 1458)     Initial Impression / Assessment and Plan / ED Course  I have reviewed the triage vital signs and the nursing notes.  Pertinent labs & imaging results that were available during my care of the patient were reviewed by me and considered in my medical decision making (see chart for details).     Given patient's abdominal pain with vomiting, as well as being a diabetic, a CT will be obtained to help rule out intra-abdominal pathology.  She has an ECG without significant changes that would suggest ACS.  She has chronic kidney disease and has a minimal bump in creatinine but is essentially at baseline.  She has been given symptomatic care with IV fentanyl and IV Zofran as well as IV fluids.  Care transferred to Dr. Deretha Emory with the CT and ultimate disposition pending.  Final Clinical Impressions(s) / ED Diagnoses   Final diagnoses:  None    ED Discharge Orders    None       Pricilla Loveless, MD 05/13/17 [phone]

## 2017-05-13 NOTE — ED Triage Notes (Addendum)
Pt c/o abd cramping and n/v/d  x 1 hr

## 2017-05-13 NOTE — ED Notes (Signed)
ED Provider at bedside, Dr. Criss AlvineGoldston.

## 2017-05-13 NOTE — ED Notes (Signed)
Patient transported to CT 

## 2017-05-13 NOTE — ED Notes (Signed)
Apparently, pt drank a bottle and a half of contrast in approximately 15 minutes, and then vomited up about of fluid.  Informed pt that she should stop drinking for now, and resume the other bottle very slowly at 1600.  Family at the bedside.

## 2017-05-13 NOTE — Discharge Instructions (Signed)
Would expect improvement over the next couple days.  If not follow-up with your doctors or return here.  Certainly return here for any new or worse symptoms.  CT scan of the abdomen without any acute findings.  Take the Zofran as needed for nausea and vomiting.

## 2017-05-13 NOTE — ED Notes (Signed)
Pt returned from CT °

## 2017-05-13 NOTE — ED Provider Notes (Signed)
CT scan of the abdomen without any acute findings.  Patient will be treated for her nausea and vomiting and follow-up with her doctor.  She will return for any new or worse symptoms.   Vanetta MuldersZackowski, [place], MD 05/13/17 1806

## 2017-05-13 NOTE — ED Notes (Signed)
Pt verbalizes understanding of d/c instructions and denies any further needs at this time. 

## 2021-05-04 ENCOUNTER — Emergency Department (HOSPITAL_BASED_OUTPATIENT_CLINIC_OR_DEPARTMENT_OTHER): Payer: Medicare Other

## 2021-05-04 ENCOUNTER — Emergency Department (HOSPITAL_BASED_OUTPATIENT_CLINIC_OR_DEPARTMENT_OTHER)
Admission: EM | Admit: 2021-05-04 | Discharge: 2021-05-04 | Disposition: A | Discharge status: Home / Self Care | Payer: Medicare Other | Attending: Emergency Medicine | Admitting: Emergency Medicine

## 2021-05-04 ENCOUNTER — Other Ambulatory Visit: Payer: Self-pay

## 2021-05-04 DIAGNOSIS — R519 Headache, unspecified: Secondary | ICD-10-CM | POA: Insufficient documentation

## 2021-05-04 DIAGNOSIS — Z794 Long term (current) use of insulin: Secondary | ICD-10-CM | POA: Insufficient documentation

## 2021-05-04 DIAGNOSIS — Z7984 Long term (current) use of oral hypoglycemic drugs: Secondary | ICD-10-CM | POA: Diagnosis not present

## 2021-05-04 DIAGNOSIS — I129 Hypertensive chronic kidney disease with stage 1 through stage 4 chronic kidney disease, or unspecified chronic kidney disease: Secondary | ICD-10-CM | POA: Insufficient documentation

## 2021-05-04 DIAGNOSIS — Z79899 Other long term (current) drug therapy: Secondary | ICD-10-CM | POA: Diagnosis not present

## 2021-05-04 DIAGNOSIS — Z7902 Long term (current) use of antithrombotics/antiplatelets: Secondary | ICD-10-CM | POA: Diagnosis not present

## 2021-05-04 DIAGNOSIS — R944 Abnormal results of kidney function studies: Secondary | ICD-10-CM | POA: Insufficient documentation

## 2021-05-04 DIAGNOSIS — E1122 Type 2 diabetes mellitus with diabetic chronic kidney disease: Secondary | ICD-10-CM | POA: Insufficient documentation

## 2021-05-04 DIAGNOSIS — R531 Weakness: Secondary | ICD-10-CM | POA: Insufficient documentation

## 2021-05-04 DIAGNOSIS — Z7982 Long term (current) use of aspirin: Secondary | ICD-10-CM | POA: Diagnosis not present

## 2021-05-04 DIAGNOSIS — N189 Chronic kidney disease, unspecified: Secondary | ICD-10-CM | POA: Diagnosis not present

## 2021-05-04 DIAGNOSIS — R030 Elevated blood-pressure reading, without diagnosis of hypertension: Secondary | ICD-10-CM

## 2021-05-04 DIAGNOSIS — Z20822 Contact with and (suspected) exposure to covid-19: Secondary | ICD-10-CM | POA: Insufficient documentation

## 2021-05-04 DIAGNOSIS — E1165 Type 2 diabetes mellitus with hyperglycemia: Secondary | ICD-10-CM | POA: Insufficient documentation

## 2021-05-04 DIAGNOSIS — R739 Hyperglycemia, unspecified: Secondary | ICD-10-CM

## 2021-05-04 DIAGNOSIS — R7989 Other specified abnormal findings of blood chemistry: Secondary | ICD-10-CM

## 2021-05-04 LAB — URINALYSIS, ROUTINE W REFLEX MICROSCOPIC
Bilirubin Urine: NEGATIVE
Glucose, UA: >=500 mg/dL — AB
Ketones, ur: NEGATIVE mg/dL
Leukocytes,Ua: NEGATIVE
Nitrite: NEGATIVE
Protein, ur: >300 mg/dL — AB
Specific Gravity, Urine: 1.02 (ref 1.005–1.030)
pH: 6 (ref 5.0–8.0)

## 2021-05-04 LAB — COMPREHENSIVE METABOLIC PANEL
ALT: 37 U/L (ref 0–44)
AST: 27 U/L (ref 15–41)
Albumin: 3.5 g/dL (ref 3.5–5.0)
Alkaline Phosphatase: 86 U/L (ref 38–126)
Anion gap: 10 (ref 5–15)
BUN: 40 mg/dL — ABNORMAL HIGH (ref 8–23)
CO2: 21 mmol/L — ABNORMAL LOW (ref 22–32)
Calcium: 8.7 mg/dL — ABNORMAL LOW (ref 8.9–10.3)
Chloride: 107 mmol/L (ref 98–111)
Creatinine, Ser: 2.09 mg/dL — ABNORMAL HIGH (ref 0.44–1.00)
GFR, Estimated: 24 mL/min — ABNORMAL LOW (ref >60)
Glucose, Bld: 296 mg/dL — ABNORMAL HIGH (ref 70–99)
Potassium: 4.8 mmol/L (ref 3.5–5.1)
Sodium: 138 mmol/L (ref 135–145)
Total Bilirubin: 0.5 mg/dL (ref 0.3–1.2)
Total Protein: 7.1 g/dL (ref 6.5–8.1)

## 2021-05-04 LAB — CBC
HCT: 36.4 % (ref 36.0–46.0)
Hemoglobin: 11.8 g/dL — ABNORMAL LOW (ref 12.0–15.0)
MCH: 27.4 pg (ref 26.0–34.0)
MCHC: 32.4 g/dL (ref 30.0–36.0)
MCV: 84.7 fL (ref 80.0–100.0)
Platelets: 246 10*3/uL (ref 150–400)
RBC: 4.3 MIL/uL (ref 3.87–5.11)
RDW: 13.3 % (ref 11.5–15.5)
WBC: 7.8 10*3/uL (ref 4.0–10.5)
nRBC: 0 % (ref 0.0–0.2)

## 2021-05-04 LAB — URINALYSIS, MICROSCOPIC (REFLEX)
RBC / HPF: NONE SEEN RBC/hpf (ref 0–5)
Squamous Epithelial / HPF: NONE SEEN (ref 0–5)
WBC, UA: NONE SEEN WBC/hpf (ref 0–5)

## 2021-05-04 LAB — CBG MONITORING, ED: Glucose-Capillary: 313 mg/dL — ABNORMAL HIGH (ref 70–99)

## 2021-05-04 LAB — RESP PANEL BY RT-PCR (FLU A&B, COVID) ARPGX2
Influenza A by PCR: NEGATIVE
Influenza B by PCR: NEGATIVE
SARS Coronavirus 2 by RT PCR: NEGATIVE

## 2021-05-04 LAB — AMMONIA: Ammonia: 29 umol/L (ref 9–35)

## 2021-05-04 MED ORDER — SODIUM CHLORIDE 0.9 % IV BOLUS: 1000.0000 mL | Freq: Once | INTRAVENOUS | Status: DC

## 2021-05-04 MED ORDER — AMLODIPINE BESYLATE 5 MG PO TABS
10.0000 mg | ORAL_TABLET | Freq: Once | ORAL | Status: AC
Start: 2021-05-04 — End: 2021-05-04
  Administered 2021-05-04: 10 mg via ORAL
  Filled 2021-05-04: qty 2

## 2021-05-04 MED ORDER — SODIUM CHLORIDE 0.9 % IV BOLUS
500.0000 mL | Freq: Once | INTRAVENOUS | Status: AC
  Administered 2021-05-04: 500 mL via INTRAVENOUS

## 2021-05-04 MED ORDER — INSULIN ASPART 100 UNIT/ML IJ SOLN
8.0000 [IU] | Freq: Once | INTRAMUSCULAR | Status: AC
  Administered 2021-05-04: 8 [IU] via SUBCUTANEOUS

## 2021-05-04 MED ORDER — ONDANSETRON HCL 4 MG/2ML IJ SOLN
4.0000 mg | Freq: Once | INTRAMUSCULAR | Status: AC
  Administered 2021-05-04: 4 mg via INTRAVENOUS
  Filled 2021-05-04: qty 2

## 2021-05-04 MED ORDER — LABETALOL HCL 100 MG PO TABS
300.0000 mg | ORAL_TABLET | Freq: Once | ORAL | Status: AC
  Administered 2021-05-04: 300 mg via ORAL
  Filled 2021-05-04: qty 3

## 2021-05-04 MED ORDER — LOSARTAN POTASSIUM 25 MG PO TABS
100.0000 mg | ORAL_TABLET | Freq: Once | ORAL | Status: AC
  Administered 2021-05-04: 100 mg via ORAL
  Filled 2021-05-04: qty 4

## 2021-05-04 NOTE — ED Notes (Signed)
IV access attempt x2 to right ac and right hand - unsuccessful.

## 2021-05-04 NOTE — ED Notes (Signed)
Pt returned from MRI °

## 2021-05-04 NOTE — Discharge Instructions (Signed)
Your workup was overall reassuring in the ED today.   Please follow up with both your PCP and your neurologist for further evaluation of your symptoms. You will need to have your kidney function rechecked in 1-2 weeks given it was slightly elevated today. Please increase the amount of water you drink on a daily basis.   Return to the ED for any new/worsening symptoms

## 2021-05-04 NOTE — ED Triage Notes (Addendum)
Pt c/o headache, feeling light headed, nausea, generalized weakness, and congestion x 3 days. Blood sugar and bp is also elevated. Pt is alert and oriented x 4.

## 2021-05-04 NOTE — ED Provider Notes (Signed)
[facility] EMERGENCY DEPARTMENT Provider Note   CSN: [id number] Arrival date & time: 05/04/21  1128     History  Chief Complaint  Patient presents with   Hyperglycemia   Headache   Nasal Congestion   Weakness    Kimberly Skinner is a 81 y.o. female with PMHx HTN, HLD, CKD, Diabetes, GERD, and TIA who presents to the ED today with complaint of gradual onset, constant, achy, diffuse, headache for the past 3 days. Pt also complains of generalized weakness/fatigue, nasal congestion, rhinorrhea. She mentions that she has also had a lack of appetite over the past few days. She denies any recent sick contacts. She was noted to be hypertensive and hyperglycemic in triage. Pt states she has not taken any of her morning medicines today. She denies blurry vision, double vision, vomiting, abdominal pain, diarrhea, unilateral weakness or numbness, or any other associated symptoms.   Daughter mentions that pt has been having frequent headaches recently. She has been seen by neurology and is currently awaiting further testing. Pt states that this headache is different than normal due to her other symptoms.   The history is provided by the patient, a relative and medical records.      Home Medications Prior to Admission medications   Medication Sig Start Date End Date Taking? Authorizing Provider  aspirin 81 MG tablet Take 81 mg by mouth daily.   Yes [provider]  cloNIDine (CATAPRES) 0.3 MG tablet Take 0.3 mg by mouth at bedtime.     Yes [provider]  clopidogrel (PLAVIX) 75 MG tablet Take 75 mg by mouth daily.     Yes [provider]  dexlansoprazole (DEXILANT) 60 MG capsule Take 60 mg by mouth daily.     Yes [provider]  dicyclomine (BENTYL) 10 MG capsule Take 10 mg by mouth 3 (three) times daily as needed. 04/11/21  Yes [provider]  divalproex (DEPAKOTE ER) 500 MG 24 hr tablet Take 500 mg by mouth daily. 04/10/21  Yes  [provider]  ezetimibe (ZETIA) 10 MG tablet Take 10 mg by mouth daily.     Yes [provider]  tiZANidine (ZANAFLEX) 4 MG tablet Take 4 mg by mouth at bedtime as needed. 01/15/21  Yes [provider]  amLODipine (NORVASC) 10 MG tablet Take 10 mg by mouth daily. 04/10/21   [provider]  atorvastatin (LIPITOR) 80 MG tablet Take 80 mg by mouth daily.      [provider]  cinacalcet (SENSIPAR) 30 MG tablet Take 30 mg by mouth daily.    [provider]  doxycycline (VIBRAMYCIN) 100 MG capsule Take 1 capsule (100 mg total) by mouth 2 (two) times daily. Take with food. 06/02/15   Kandra Nicolas, MD  febuxostat (ULORIC) 40 MG tablet Take 40 mg by mouth daily. 02/28/21   [provider]  furosemide (LASIX) 20 MG tablet Take 40 mg by mouth daily.    [provider]  HUMULIN 70/30 (70-30) 100 UNIT/ML injection Inject 80 Units into the skin in the morning. 40 units in evening 03/23/21   [provider]  HYDROcodone-acetaminophen (NORCO/VICODIN) 5-325 MG per tablet Take 1 tablet by mouth every 6 (six) hours as needed for pain. 09/30/12   Blanchie Dessert, MD  labetalol (NORMODYNE) 300 MG tablet Take 300 mg by mouth 2 (two) times daily. 04/03/21   [provider]  linagliptin (TRADJENTA) 5 MG TABS tablet Take 5 mg by mouth daily.  [provider]  losartan (COZAAR) 100 MG tablet Take 100 mg by mouth daily. 03/30/21   [provider]  metoprolol (TOPROL-XL) 200 MG 24 hr tablet Take 100 mg by mouth daily.      [provider]  ondansetron (ZOFRAN ODT) 4 MG disintegrating tablet Take 1 tablet (4 mg total) by mouth every 8 (eight) hours as needed. 05/13/17   Fredia Sorrow, MD  rosuvastatin (CRESTOR) 10 MG tablet Take 10 mg by mouth daily. 03/19/21   [provider]  spironolactone (ALDACTONE) 25 MG tablet Take 50 mg by mouth daily.      [provider]  triamcinolone  cream (KENALOG) 0.1 % Apply thin film to rash two or three times daily 06/02/15   Kandra Nicolas, MD  valsartan (DIOVAN) 320 MG tablet Take 320 mg by mouth daily.      [provider]      Allergies    Codeine    Review of Systems   Review of Systems  Constitutional:  Positive for appetite change and fatigue. Negative for chills and fever.  Eyes:  Negative for visual disturbance.  Respiratory:  Negative for cough.   Cardiovascular:  Negative for chest pain.  Gastrointestinal:  Positive for nausea. Negative for abdominal pain, constipation, diarrhea and vomiting.  Neurological:  Positive for weakness (generalized) and headaches. Negative for dizziness, seizures, syncope, light-headedness and numbness.  All other systems reviewed and are negative.  Physical Exam Updated Vital Signs BP (!) 173/55    Pulse 64    Temp 98.3 F (36.8 C) (Oral)    Resp 18    SpO2 95%  Physical Exam Vitals and nursing note reviewed.  Constitutional:      Appearance: She is not ill-appearing or diaphoretic.     Comments: Tremulous  HENT:     Head: Normocephalic and atraumatic.  Eyes:     Conjunctiva/sclera: Conjunctivae normal.  Cardiovascular:     Rate and Rhythm: Normal rate and regular rhythm.     Heart sounds: Normal heart sounds.  Pulmonary:     Effort: Pulmonary effort is normal.     Breath sounds: Normal breath sounds. No wheezing, rhonchi or rales.  Abdominal:     Palpations: Abdomen is soft.     Tenderness: There is no abdominal tenderness.  Musculoskeletal:        General: Normal range of motion.     Cervical back: Neck supple.  Skin:    General: Skin is warm and dry.  Neurological:     Mental Status: She is alert and oriented to person, place, and time.     Comments: Alert and oriented to self, place, time and event.   Speech is fluent, clear without dysarthria or dysphasia.   Strength 5/5 in upper/lower extremities   Sensation intact in upper/lower extremities   Normal  gait.  Negative Romberg. No pronator drift.  Normal finger-to-nose and feet tapping.  CN I not tested  CN II grossly intact visual fields bilaterally. Did not visualize posterior eye.  CN III, IV, VI PERRLA and EOMs intact bilaterally  CN V Intact sensation to sharp and light touch to the face  CN VII facial movements symmetric  CN VIII not tested  CN IX, X no uvula deviation, symmetric rise of soft palate  CN XI 5/5 SCM and trapezius strength bilaterally  CN XII Midline tongue protrusion, symmetric L/R movements      ED Results / Procedures / Treatments   Labs (all labs  ordered are listed, but only abnormal results are displayed) Labs Reviewed  CBC - Abnormal; Notable for the following components:      Result Value   Hemoglobin 11.8 (*)    All other components within normal limits  URINALYSIS, ROUTINE W REFLEX MICROSCOPIC - Abnormal; Notable for the following components:   Glucose, UA >=500 (*)    Hgb urine dipstick TRACE (*)    Protein, ur >300 (*)    All other components within normal limits  COMPREHENSIVE METABOLIC PANEL - Abnormal; Notable for the following components:   CO2 21 (*)    Glucose, Bld 296 (*)    BUN 40 (*)    Creatinine, Ser 2.09 (*)    Calcium 8.7 (*)    GFR, Estimated 24 (*)    All other components within normal limits  URINALYSIS, MICROSCOPIC (REFLEX) - Abnormal; Notable for the following components:   Bacteria, UA FEW (*)    All other components within normal limits  CBG MONITORING, ED - Abnormal; Notable for the following components:   Glucose-Capillary 313 (*)    All other components within normal limits  RESP PANEL BY RT-PCR (FLU A&B, COVID) ARPGX2  AMMONIA  CBG MONITORING, ED  CBG MONITORING, ED    EKG EKG Interpretation  Date/Time:  Saturday May 04 2021 12:01:05 EST Ventricular Rate:  67 PR Interval:  198 QRS Duration: 74 QT Interval:  430 QTC Calculation: 454 R Axis:   63 Text Interpretation: Normal sinus rhythm Normal ECG When  compared with ECG of 13-May-2017 15:09, PREVIOUS ECG IS PRESENT No significant change since last tracing Confirmed by Isla Pence [phone]) on 05/04/2021 12:03:36 PM  Radiology CT Head Wo Contrast  Result Date: 05/04/2021 CLINICAL DATA:  Headache EXAM: CT HEAD WITHOUT CONTRAST TECHNIQUE: Contiguous axial images were obtained from the base of the skull through the vertex without intravenous contrast. RADIATION DOSE REDUCTION: This exam was performed according to the departmental dose-optimization program which includes automated exposure control, adjustment of the mA and/or kV according to patient size and/or use of iterative reconstruction technique. COMPARISON:  CT head 05/24/2017 FINDINGS: Brain: No acute intracranial hemorrhage, mass effect, or herniation. No extra-axial fluid collections. No evidence of acute territorial infarct. No hydrocephalus. Patchy hypodensities in the periventricular and subcortical white matter, likely secondary to chronic microvascular ischemic changes. 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 Vascular: Calcified plaques in the carotid siphons. Skull: Hyperostosis frontalis interna. Negative for fracture or focal lesion. Sinuses/Orbits: No acute finding. Other: None. IMPRESSION: Chronic changes as described with no acute intracranial process identified. Electronically Signed   By: Ofilia Neas M.D.   On: 05/04/2021 12:54   MR BRAIN WO CONTRAST  Result Date: 05/04/2021 CLINICAL DATA:  Chronic headache with new features or increased frequency. EXAM: MRI HEAD WITHOUT CONTRAST TECHNIQUE: Multiplanar, multiecho pulse sequences of the brain and surrounding structures were obtained without intravenous contrast. COMPARISON:  MRI head 01/26/2010.  CT head 05/04/2021 FINDINGS: Brain: Negative for acute infarct.  Negative for hemorrhage or mass Moderate chronic microvascular ischemic changes in the white matter. Chronic infarct left internal capsule.  Progressive chronic ischemic change since 2011. Ventricle size and cerebral volume normal. Vascular: Normal arterial flow voids. Skull and upper cervical spine: No focal skeletal abnormality. Sinuses/Orbits: Mild mucosal edema paranasal sinuses. Bilateral cataract extraction. Other: None IMPRESSION: Negative for acute infarct Moderate chronic microvascular ischemic change. Electronically Signed   By: Franchot Gallo M.D.   On: 05/04/2021 16:58    Procedures Procedures  Medications Ordered in ED Medications  ondansetron (ZOFRAN) injection 4 mg (4 mg Intravenous Given 05/04/21 1343)  sodium chloride 0.9 % bolus 500 mL (0 mLs Intravenous Stopped 05/04/21 1439)  amLODipine (NORVASC) tablet 10 mg (10 mg Oral Given 05/04/21 1346)  labetalol (NORMODYNE) tablet 300 mg (300 mg Oral Given 05/04/21 1345)  losartan (COZAAR) tablet 100 mg (100 mg Oral Given 05/04/21 1348)  insulin aspart (novoLOG) injection 8 Units (8 Units Subcutaneous Given 05/04/21 1349)  sodium chloride 0.9 % bolus 500 mL (500 mLs Intravenous New Bag/Given 05/04/21 1532)    ED Course/ Medical Decision Making/ A&P Clinical Course as of 05/04/21 1716  Sat May 04, 2021  1444 POC CBG, ED 230 [MV]    Clinical Course User Index [MV] Eustaquio Maize, PA-C                           Medical Decision Making 81 year old female who presents to the ED today with complaint of headache, generalized weakness/fatigue, rhinorrhea/congestion for the past 3 days.  On arrival to the ED she is noted to be hypertensive with a blood pressure of 220/56.  Reports she did not take any of her blood pressure medications this morning.  Per chart review it does not appear that our med list has been updated since 2019.  Nursing staff to update list prior to providing additional antihypertensive medication at this time.  She is also hyperglycemic with a CBG of 313.  She did not take her insulin this morning either.  She denies missing any other doses besides this  morning.  On my exam she has no focal neurodeficits.  She is noted to be tremulous.  Daughter reports history of tremor however states that it seems worse than normal today.  We will plan for lab work at this time including CBC, CMP, UA, vitamin flu testing.  Given headache and elevated blood pressure we will also plan for CT head for further evaluation.  Daughter reports frequent headaches recently and is followed by neurology however states they do not think it is related to migraines.  Difficult to assess whether patient is having headache related to elevated blood pressure versus viral illness given congestion/rhinorrhea today versus other abnormality.  Low suspicion for stroke at this time however patient does have history of TIA in the past.   Workup overall reassuring. No acute findings at this time. Do not feel pt requires admission today. Attending physician Dr. Gilford Raid evaluated patient as well and agrees with plan.   Problems Addressed: Elevated blood pressure reading: acute illness or injury    Details: Pt did not take BP meds this morning. Repeat after meds 157/50. Elevated serum creatinine: acute illness or injury    Details: Slightly elevated today at 2.09. Pt received fluids. WIll need to have recheck of kidney function in 1-2 weeks by PCP. QUestion some dehydration causing generalized weakness today. Hyperglycemia: acute illness or injury    Details: No signs of DKA. Repeat CBG 230 Nonintractable headache, unspecified chronicity pattern, unspecified headache type: chronic illness or injury    Details: This does appear more chronic in nature. Being followed by neurology for same. CT head and MRI brain negative at this time.  Amount and/or Complexity of Data Reviewed Labs: ordered. Decision-making details documented in ED Course.    Details: CBC without leukocytosis. Hgb stable at 11.8 CMP with glucose 296, bicarb 21, no gap. Creatinine slightly elevated at 2.09 (baseline 1.6) and  BUN  40. Pt recieving fluids at this time.  Ammonia WNL at 29 U/A without signs of infection COVID and flu  negative Radiology: ordered.    Details: CT head negative. Given complaint of chronic worsening headache and tremors will proceed with MRI  MRI negative for acute findings  Risk Prescription drug management.           Final Clinical Impression(s) / ED Diagnoses Final diagnoses:  Elevated blood pressure reading  Hyperglycemia  Nonintractable headache, unspecified chronicity pattern, unspecified headache type  Weakness  Elevated serum creatinine    Rx / DC Orders ED Discharge Orders     None        Discharge Instructions      Your workup was overall reassuring in the ED today.   Please follow up with both your PCP and your neurologist for further evaluation of your symptoms. You will need to have your kidney function rechecked in 1-2 weeks given it was slightly elevated today. Please increase the amount of water you drink on a daily basis.   Return to the ED for any new/worsening symptoms        Eustaquio Maize, Hershal Coria 05/04/21 1716    Isla Pence, MD 05/05/21 [phone]

## 2021-05-04 NOTE — ED Notes (Signed)
Pt transported to MRI 

## 2021-05-04 NOTE — ED Notes (Signed)
Pt ambulatory to bathroom using personal cane, standby assistance.

## 2021-05-04 NOTE — ED Notes (Signed)
BG 230

## 2021-05-06 LAB — CBG MONITORING, ED: Glucose-Capillary: 230 mg/dL — ABNORMAL HIGH (ref 70–99)

## 2021-11-12 DEATH — deceased

## 2022-12-03 IMAGING — MR MR HEAD W/O CM
10 series · 48 of 48 positions shown · non-contrast
Comparison: MRI head 01/26/2010.  CT head 05/04/2021

CLINICAL DATA: Chronic headache with new features or increased
frequency.

EXAM:
MRI HEAD WITHOUT CONTRAST
TECHNIQUE: Multiplanar, multiecho pulse sequences of the brain and surrounding
structures were obtained without intravenous contrast.

[Series 2: T1 · sagittal · 5.0mm · 0.90mm/px · 1 of 27 slices shown]
[im 1/27]
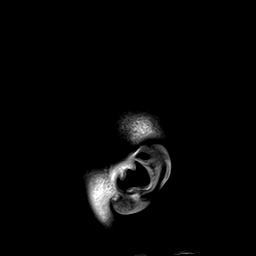

[Series 3: DWI · axial · 3.0mm · 1.88mm/px · z∈[-65,+88]mm · 9 of 96 slices shown (1 of 4)]
[im 1/96]
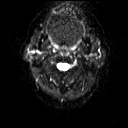
[im 12/96]
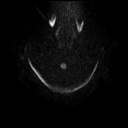
[im 24/96]
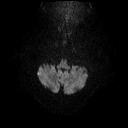
[im 36/96]
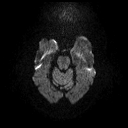
[im 48/96]
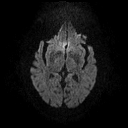
[im 60/96]
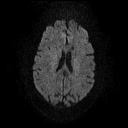
[im 72/96]
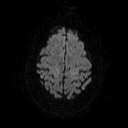
[im 84/96]
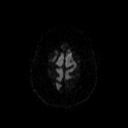
[im 96/96]
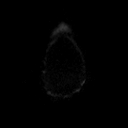

[Series 4: DWI · axial · 3.0mm · 1.88mm/px · z∈[-65,+88]mm · 4 of 48 slices shown (2 of 4)]
[im 1/48]
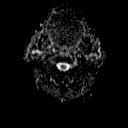
[im 16/48]
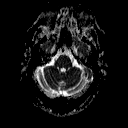
[im 32/48]
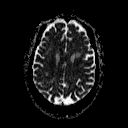
[im 48/48]
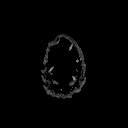

[Series 5: DWI · coronal · 3.0mm · 1.88mm/px · 8 of 86 slices shown (3 of 4)]
[im 1/86]
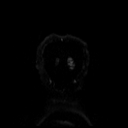
[im 13/86]
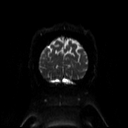
[im 25/86]
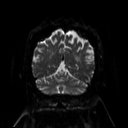
[im 37/86]
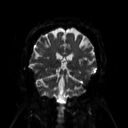
[im 49/86]
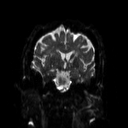
[im 61/86]
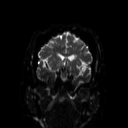
[im 73/86]
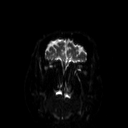
[im 86/86]
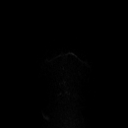

[Series 6: DWI · coronal · 3.0mm · 1.88mm/px · 4 of 44 slices shown (4 of 4)]
[im 1/44]
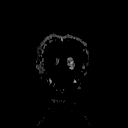
[im 15/44]
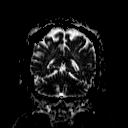
[im 29/44]
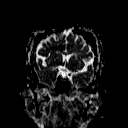
[im 44/44]
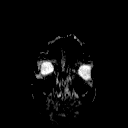

[Series 7: T2 · axial · 5.0mm · 0.69mm/px · z∈[-70,+90]mm · 3 of 28 slices shown (1 of 3)]
[im 1/28]
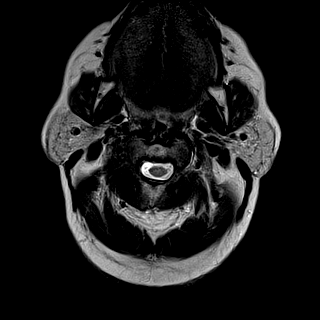
[im 14/28]
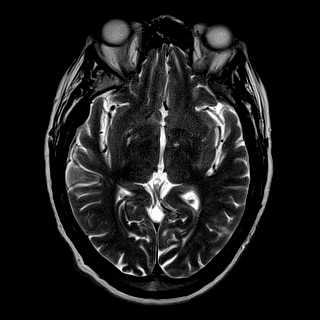
[im 28/28]
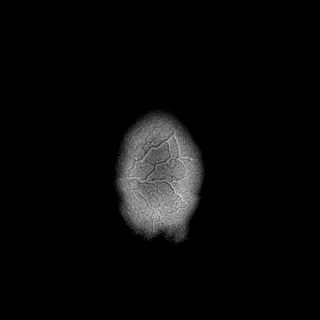

[Series 8: T2 · axial · 5.0mm · 0.43mm/px · z∈[-70,+90]mm · 3 of 28 slices shown (2 of 3)]
[im 1/28]
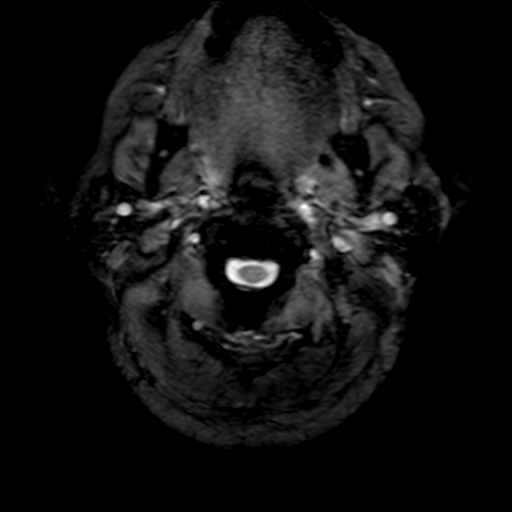
[im 14/28]
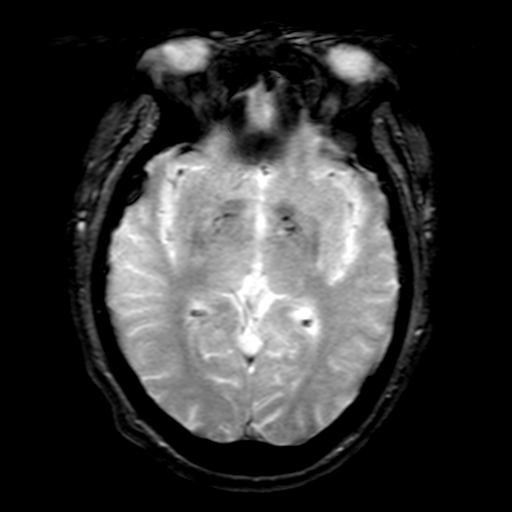
[im 28/28]
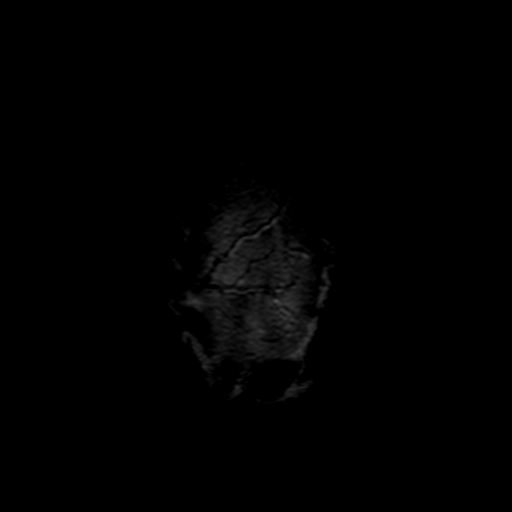

[Series 9: FLAIR · axial · 3.0mm · 0.43mm/px · z∈[-71,+91]mm · 4 of 42 slices shown]
[im 1/42]
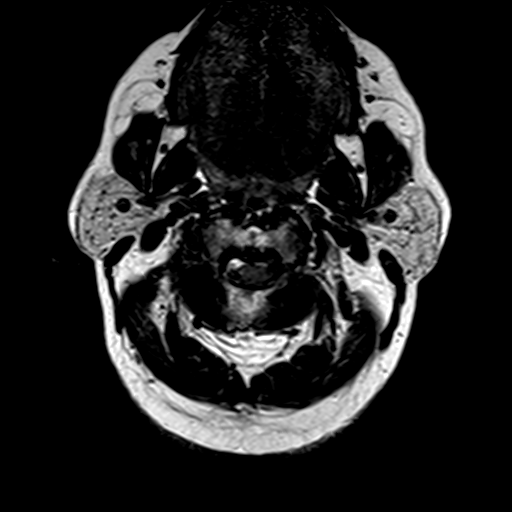
[im 14/42]
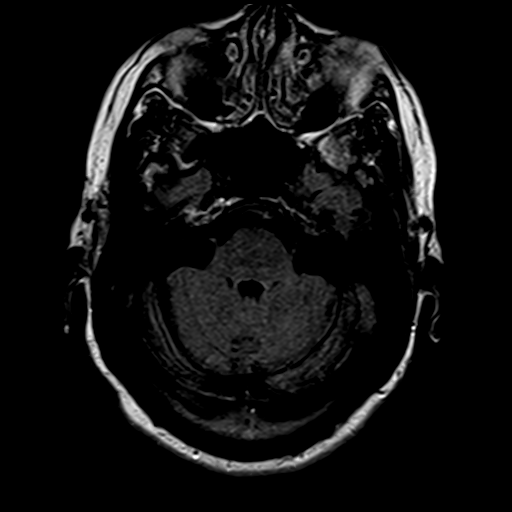
[im 28/42]
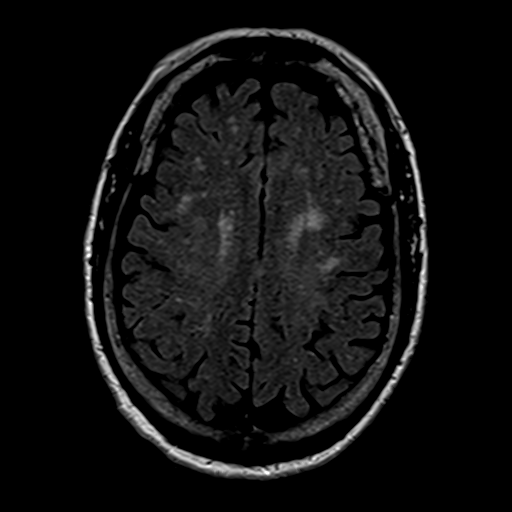
[im 42/42]
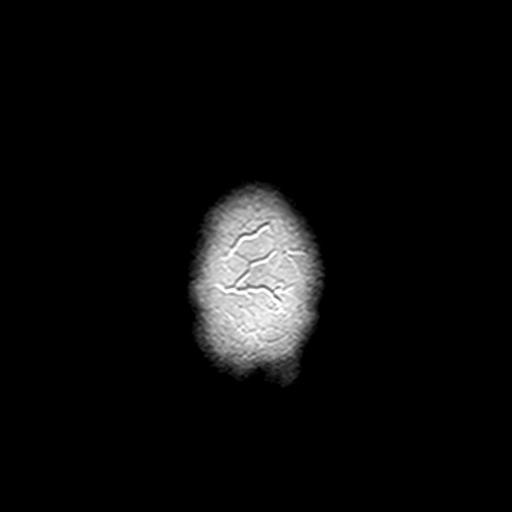

[Series 10: t1_3d_tra · axial · 2.0mm · 0.94mm/px · z∈[-75,+113]mm · 9 of 96 slices shown]
[im 1/96]
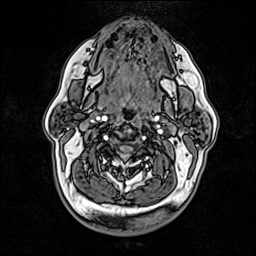
[im 12/96]
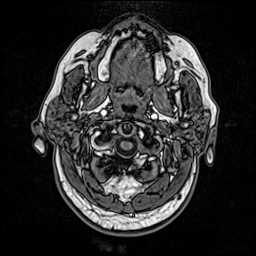
[im 24/96]
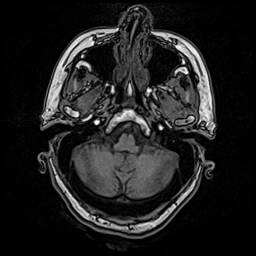
[im 36/96]
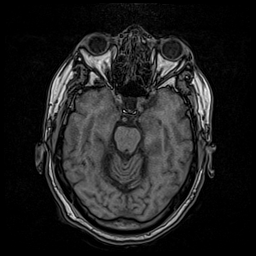
[im 48/96]
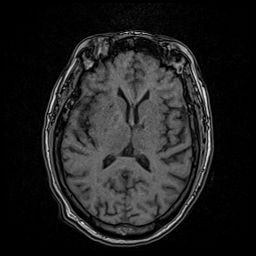
[im 60/96]
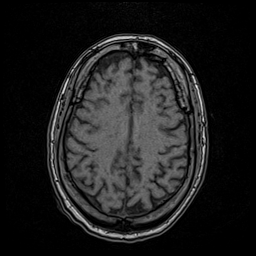
[im 72/96]
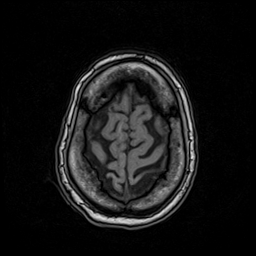
[im 84/96]
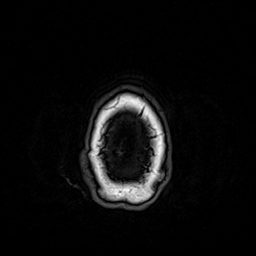
[im 96/96]
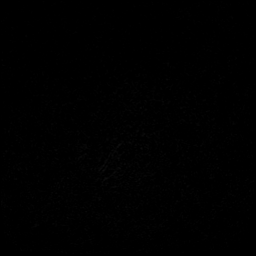

[Series 11: T2 · coronal · 5.0mm · 0.69mm/px · 3 of 30 slices shown (3 of 3)]
[im 1/30]
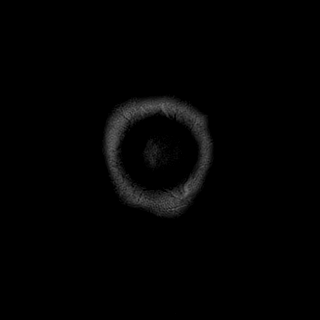
[im 15/30]
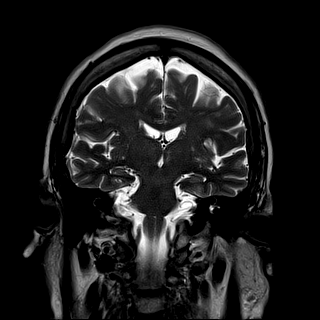
[im 30/30]
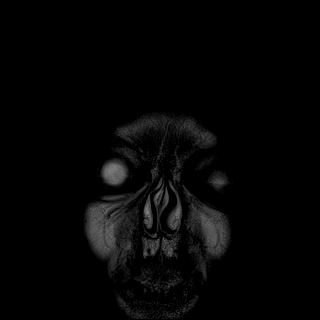

[48 of 48 positions shown; findings below may reference images not displayed]

FINDINGS: Brain: Negative for acute infarct.  Negative for hemorrhage or mass

Moderate chronic microvascular ischemic changes in the white matter.
Chronic infarct left internal capsule. Progressive chronic ischemic
change since 3755. Ventricle size and cerebral volume normal.

Vascular: Normal arterial flow voids.

Skull and upper cervical spine: No focal skeletal abnormality.

Sinuses/Orbits: Mild mucosal edema paranasal sinuses. Bilateral
cataract extraction.

Other: None
IMPRESSION: Negative for acute infarct

Moderate chronic microvascular ischemic change.
